# Patient Record
Sex: Female | Born: 1990 | Race: Black or African American | Hispanic: No | Marital: Single | Smoking: Former smoker
Health system: Southern US, Community
[De-identification: ages and names within clinical notes are randomized; demographics above are authoritative.]

## PROBLEM LIST (undated history)

## (undated) DIAGNOSIS — F419 Anxiety disorder, unspecified: Secondary | ICD-10-CM

## (undated) DIAGNOSIS — D649 Anemia, unspecified: Secondary | ICD-10-CM

## (undated) DIAGNOSIS — Z789 Other specified health status: Secondary | ICD-10-CM

## (undated) DIAGNOSIS — F32A Depression, unspecified: Secondary | ICD-10-CM

## (undated) HISTORY — DX: Other specified health status: Z78.9

## (undated) HISTORY — DX: Anemia, unspecified: D64.9

## (undated) HISTORY — DX: Anxiety disorder, unspecified: F41.9

## (undated) HISTORY — DX: Depression, unspecified: F32.A

---
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
## 2012-06-14 ENCOUNTER — Emergency Department: Payer: Self-pay

## 2012-06-14 LAB — MONONUCLEOSIS SCREEN: Mono Test: NEGATIVE

## 2012-10-17 ENCOUNTER — Emergency Department: Payer: Self-pay | Admitting: Emergency Medicine

## 2012-10-17 LAB — COMPREHENSIVE METABOLIC PANEL
Albumin: 3.5 g/dL (ref 3.4–5.0)
BUN: 13 mg/dL (ref 7–18)
Bilirubin,Total: 0.2 mg/dL (ref 0.2–1.0)
Calcium, Total: 9.3 mg/dL (ref 8.5–10.1)
Chloride: 104 mmol/L (ref 98–107)
Co2: 22 mmol/L (ref 21–32)
Creatinine: 0.71 mg/dL (ref 0.60–1.30)
EGFR (African American): >60
EGFR (Non-African Amer.): >60
Glucose: 87 mg/dL (ref 65–99)
Osmolality: 271 (ref 275–301)
Potassium: 4 mmol/L (ref 3.5–5.1)
SGOT(AST): 9 U/L — ABNORMAL LOW (ref 15–37)
SGPT (ALT): 14 U/L (ref 12–78)
Sodium: 136 mmol/L (ref 136–145)

## 2012-10-17 LAB — CBC
HGB: 12.8 g/dL (ref 12.0–16.0)
MCHC: 34.2 g/dL (ref 32.0–36.0)
RDW: 13 % (ref 11.5–14.5)

## 2012-10-17 LAB — HCG, QUANTITATIVE, PREGNANCY: Beta Hcg, Quant.: 30437 m[IU]/mL — ABNORMAL HIGH

## 2012-10-17 LAB — URINALYSIS, COMPLETE
Bilirubin,UR: NEGATIVE
Blood: NEGATIVE
Ketone: NEGATIVE
Protein: NEGATIVE
RBC,UR: 6 /HPF (ref 0–5)
WBC UR: 9 /HPF (ref 0–5)

## 2013-02-15 ENCOUNTER — Ambulatory Visit: Payer: Self-pay | Admitting: Advanced Practice Midwife

## 2013-02-15 DIAGNOSIS — I499 Cardiac arrhythmia, unspecified: Secondary | ICD-10-CM

## 2013-04-25 ENCOUNTER — Observation Stay: Payer: Self-pay | Admitting: Obstetrics and Gynecology

## 2013-04-25 LAB — URINALYSIS, COMPLETE
Bilirubin,UR: NEGATIVE
Blood: NEGATIVE
Glucose,UR: NEGATIVE mg/dL (ref 0–75)
Ketone: NEGATIVE
Leukocyte Esterase: NEGATIVE
Nitrite: NEGATIVE
Ph: 6 (ref 4.5–8.0)
Protein: NEGATIVE
RBC,UR: 1 /HPF (ref 0–5)
Specific Gravity: 1.021 (ref 1.003–1.030)
Squamous Epithelial: 11
WBC UR: 5 /HPF (ref 0–5)

## 2013-05-30 ENCOUNTER — Observation Stay: Payer: Self-pay | Admitting: Advanced Practice Midwife

## 2013-06-06 ENCOUNTER — Observation Stay: Payer: Self-pay

## 2013-06-14 ENCOUNTER — Inpatient Hospital Stay: Payer: Self-pay | Admitting: Obstetrics and Gynecology

## 2013-06-14 LAB — CBC WITH DIFFERENTIAL/PLATELET
Basophil %: 0.9 %
Eosinophil #: 0.1 10*3/uL (ref 0.0–0.7)
Eosinophil %: 0.7 %
HCT: 36.6 % (ref 35.0–47.0)
HGB: 12.8 g/dL (ref 12.0–16.0)
Lymphocyte #: 1.7 10*3/uL (ref 1.0–3.6)
MCHC: 34.8 g/dL (ref 32.0–36.0)
MCV: 94 fL (ref 80–100)
Monocyte #: 0.4 x10 3/mm (ref 0.2–0.9)
Monocyte %: 6.1 %
Platelet: 109 10*3/uL — ABNORMAL LOW (ref 150–440)
RBC: 3.91 10*6/uL (ref 3.80–5.20)
RDW: 13.9 % (ref 11.5–14.5)

## 2013-06-14 LAB — GC/CHLAMYDIA PROBE AMP

## 2013-09-17 LAB — HM PAP SMEAR: HM Pap smear: NEGATIVE

## 2014-03-13 ENCOUNTER — Emergency Department: Payer: Self-pay | Admitting: Emergency Medicine

## 2014-06-18 ENCOUNTER — Emergency Department: Payer: Self-pay | Admitting: Emergency Medicine

## 2014-06-20 LAB — BETA STREP CULTURE(ARMC)

## 2014-08-31 ENCOUNTER — Emergency Department: Payer: Self-pay | Admitting: Internal Medicine

## 2014-08-31 LAB — COMPREHENSIVE METABOLIC PANEL
ALK PHOS: 64 U/L
ALT: 13 U/L — AB
Albumin: 3.5 g/dL (ref 3.4–5.0)
Anion Gap: 4 — ABNORMAL LOW (ref 7–16)
BILIRUBIN TOTAL: 0.4 mg/dL (ref 0.2–1.0)
BUN: 7 mg/dL (ref 7–18)
CALCIUM: 8.3 mg/dL — AB (ref 8.5–10.1)
CHLORIDE: 110 mmol/L — AB (ref 98–107)
CO2: 25 mmol/L (ref 21–32)
CREATININE: 0.67 mg/dL (ref 0.60–1.30)
EGFR (African American): >60
Glucose: 84 mg/dL (ref 65–99)
Osmolality: 275 (ref 275–301)
Potassium: 3.7 mmol/L (ref 3.5–5.1)
SGOT(AST): 15 U/L (ref 15–37)
Sodium: 139 mmol/L (ref 136–145)
Total Protein: 7.5 g/dL (ref 6.4–8.2)

## 2014-08-31 LAB — CBC
HCT: 40.4 % (ref 35.0–47.0)
HGB: 13.7 g/dL (ref 12.0–16.0)
MCH: 31.6 pg (ref 26.0–34.0)
MCHC: 33.9 g/dL (ref 32.0–36.0)
MCV: 93 fL (ref 80–100)
Platelet: 167 10*3/uL (ref 150–440)
RBC: 4.33 10*6/uL (ref 3.80–5.20)
RDW: 13.4 % (ref 11.5–14.5)
WBC: 7.5 10*3/uL (ref 3.6–11.0)

## 2014-08-31 LAB — LIPASE, BLOOD: LIPASE: 210 U/L (ref 73–393)

## 2014-08-31 LAB — URINALYSIS, COMPLETE
BILIRUBIN, UR: NEGATIVE
Blood: NEGATIVE
Glucose,UR: NEGATIVE mg/dL (ref 0–75)
KETONE: NEGATIVE
Nitrite: NEGATIVE
PROTEIN: NEGATIVE
Ph: 6 (ref 4.5–8.0)
RBC,UR: 10 /HPF (ref 0–5)
Specific Gravity: 1.017 (ref 1.003–1.030)
WBC UR: 48 /HPF (ref 0–5)

## 2014-09-06 DIAGNOSIS — E663 Overweight: Secondary | ICD-10-CM

## 2014-09-06 HISTORY — DX: Overweight: E66.3

## 2015-01-27 ENCOUNTER — Emergency Department
Admit: 2015-01-27 | Disposition: A | Discharge status: Home / Self Care | Payer: Self-pay | Admitting: Emergency Medicine

## 2015-01-27 LAB — CBC
HCT: 41.3 % (ref 35.0–47.0)
HGB: 13.6 g/dL (ref 12.0–16.0)
MCH: 30.8 pg (ref 26.0–34.0)
MCHC: 33 g/dL (ref 32.0–36.0)
MCV: 93 fL (ref 80–100)
Platelet: 207 10*3/uL (ref 150–440)
RBC: 4.42 10*6/uL (ref 3.80–5.20)
RDW: 13 % (ref 11.5–14.5)
WBC: 7.6 10*3/uL (ref 3.6–11.0)

## 2015-01-27 LAB — URINALYSIS, COMPLETE
Bacteria: NONE SEEN
Bilirubin,UR: NEGATIVE
Blood: NEGATIVE
Glucose,UR: NEGATIVE mg/dL (ref 0–75)
Ketone: NEGATIVE
LEUKOCYTE ESTERASE: NEGATIVE
Nitrite: NEGATIVE
PH: 6 (ref 4.5–8.0)
PROTEIN: NEGATIVE
RBC,UR: 1 /HPF (ref 0–5)
Specific Gravity: 1.025 (ref 1.003–1.030)
Squamous Epithelial: 8

## 2015-01-27 LAB — COMPREHENSIVE METABOLIC PANEL
Albumin: 4 g/dL
Alkaline Phosphatase: 62 U/L
Anion Gap: 6 — ABNORMAL LOW (ref 7–16)
BILIRUBIN TOTAL: 0.6 mg/dL
BUN: 11 mg/dL
CALCIUM: 9 mg/dL
CHLORIDE: 105 mmol/L
CO2: 27 mmol/L
CREATININE: 0.63 mg/dL
EGFR (Non-African Amer.): >60
GLUCOSE: 87 mg/dL
Potassium: 3.7 mmol/L
SGOT(AST): 15 U/L
SGPT (ALT): 11 U/L — ABNORMAL LOW
Sodium: 138 mmol/L
Total Protein: 7.8 g/dL

## 2015-01-27 LAB — HCG, QUANTITATIVE, PREGNANCY: Beta Hcg, Quant.: <1 m[IU]/mL

## 2015-01-27 LAB — WET PREP, GENITAL

## 2015-01-29 LAB — GC/CHLAMYDIA PROBE AMP

## 2015-02-14 NOTE — Op Note (Signed)
PATIENT NAME:  Shelley Arias, Shelley Arias MR#:  [id number] DATE OF BIRTH:  1991/09/04  DATE OF PROCEDURE:  06/14/2013  PREOPERATIVE DIAGNOSIS:  Chorioamnionitis.   POSTOPERATIVE DIAGNOSIS:  Chorioamnionitis.  PROCEDURE:  Low transverse C-section.   ANESTHESIA:  Epidural.  SURGEON:  Senaida LangeLashawn Weaver-Lee, M.D.   ASSISTANLawson Fiscal:  Lori, scrub tech.   ESTIMATED BLOOD LOSS:  400 mL.   OPERATIVE FLUIDS:  650 mL, urine output 35 mL.   COMPLICATIONS:  None.   FINDINGS:  Vertex female infant, 3080 grams, Apgars 5 and 8, thick meconium, nuchal cord x 1, normal uterus, tubes and ovaries.   SPECIMENS:  Placenta.   INDICATIONS:  The patient is a 24 year old who presents with painful contractions.  An ultrasound was performed which showed oligohydramnios to AFI of 2.  Decision was made to induce labor at this point.  The patient progressed in labor slowly, however became febrile and began to have fetal tachycardia.  She also subsequently started having late decelerations.  Therefore, the decision was made to deliver via cesarean section.  Risks, benefits, indications and alternatives of the procedure were explained and informed consent was obtained.   PROCEDURE:  The patient was taken to the Operating Room with IV fluids running.  She was prepped and draped in the usual sterile fashion with a leftward tilt.  Pfannenstiel skin incision was made, carried down to the underlying fascia with a knife.  The fascia was nicked in the midline.  The incision was extended laterally.  The superior aspect of the fascia was grasped with Kocher clamps and the underlying rectus muscles were dissected off.  This was repeated on the inferior fascia.  Rectus muscles were divided in the midline.  The peritoneum was entered bluntly.  The opening was extended.  A bladder blade was placed.  The vesicouterine peritoneum was grasped with pick-ups and entered with Metzenbaum's.  The bladder flap was created digitally.  Hysterotomy incision was  made and carried out to underlying fetal membranes.  Thick meconium was encountered at this point.  The infant's head was grasped and delivered atraumatically through the hysterotomy incision.  The anterior and posterior shoulder were delivered after nuchal cord was reduced x 1 and the remainder of the body was delivered.  The cord was clamped x 2 and cut and the infant was handed to the waiting nursery staff.  The placenta was expressed.  The uterus was exteriorized and cleared of all clot and debris.  The hysterotomy incision was repaired with #0 Monocryl in a running locked fashion.  The uterus was returned to the abdomen.  The abdomen and gutters were irrigated with copious amounts of warm normal saline.  The peritoneum was closed with #0 Vicryl.  The On-Q pump apparatus was placed according to manufacturer's instructions.  The fascia was closed with a #1 PDS.  The skin was closed with 4-0 Vicryl and a Mellody DanceKeith needle.  Both On-Q catheters were bolused with 0.5% bupivacaine 5 mL each.  The catheters were secured to the patient's abdomen using Steri-Strips and Tegaderm.  The patient tolerated the procedure well.  Sponge, needle and instrument counts were correct x 2 and the patient was taken to the recovery room in stable condition.     ____________________________ Sonda PrimesLashawn A. Patton SallesWeaver-Lee, MD law:ea D: 06/15/2013 01:31:49 ET T: 06/15/2013 01:43:36 ET JOB#: [id number]  cc: Flint MelterLashawn A. Patton SallesWeaver-Lee, MD, <Dictator> Janyth ContesLASHAWN A WEAVER LEE MD ELECTRONICALLY SIGNED 06/21/2013 7:31

## 2015-03-04 NOTE — H&P (Signed)
L&D Evaluation:  History Expanded:  HPI 24 yo G1, EDD of 06/12/13 per 6 week US, presents at 38 1/7 weeks with c/o leaking fluid at 1800 that got her pants wet, noted another gush later. Denies ctx, VB or decreased FM. PNC at [facility] notable for early entry to care, irregular HR with normal EKG's with rare PVCs, mild anemia, GBS bacteriuria.   Blood Type (Maternal) A positive   Group B Strep Results Maternal (Result >5wks must be treated as unknown) positive  in urine   Maternal HIV Negative   Maternal Syphilis Ab Nonreactive   Maternal Varicella Non-Immune   Rubella Results (Maternal) immune   Maternal T-Dap Unknown   Presents with leaking fluid   Patient's Medical History No Chronic Illness   Patient's Surgical History none   Medications Pre Natal Vitamins  Iron   Allergies NKDA   Social History none  former tobacco   ROS:  ROS see HPI   Exam:  Vital Signs stable   General no apparent distress   Mental Status clear   Abdomen gravid, non-tender   Estimated Fetal Weight Average for gestational age   Fetal Position vertex confirmed by bedside US   Pelvic no external lesions, cervix closed and thick   Mebranes Intact, speculum exam: ferning, pooling & nitrizine negative   FHT normal rate with no decels, category 1 tracing   Ucx irregular   Other wet mount negative for BV, yeast or trichomonas   Impression:  Impression IUP at 38 1/7 weeks, intact membranes   Plan:  Comments Will monitor for further leaking, d/c after 1 hour if no further leaking. Has f/u tomorrow at [facility].   Follow Up Appointment already scheduled. 05/31/13   Electronic Signatures: Vella KohlerBrothers, Martavius Lusty K (CNM)  (Signed 06-Aug-14 22:24)  Authored: L&D Evaluation   Last Updated: 06-Aug-14 22:24 by Vella KohlerBrothers, Brynnan Rodenbaugh K (CNM)

## 2015-03-04 NOTE — H&P (Signed)
L&D Evaluation:  History:  HPI 24 year old G1 P0 with EDC=06/12/2013 by a 6week ultrasound presents at 40  2/7 weeks presents with contractions since 1830 last night and a blood tinged discharge this AM. "Contractions are unbearable" this AM and she rates the pain 10/10. Has been having an increased discharge the last few days. Prenatal care at [facility] remarkable for an early first trimester ultrasound dating the pregnancy and an anatomy scan at 18wk 1 day at which time the baby was measuring 20 wk 4 day. PNC also remarkable for GBS bacteruria, and anemia with a hmg=10.3gm/dl for which she has been treated with iron. LABS: A POS, RI (presumed), varicella NI.   Presents with contractions, and pink discharge   Patient's Medical History Anemia this pregnancy.   Patient's Surgical History none   Medications Iron   Allergies PCN, causes rash   Social History none   Family History Non-Contributory   ROS:  ROS see HPI   Exam:  Vital Signs stable  111/72, afebrile   Urine Protein not completed   General breathing thru contractions   Mental Status clear   Chest clear   Heart normal sinus rhythm, no murmur/gallop/rubs   Abdomen gravid, tender with contractions   Estimated Fetal Weight Average for gestational age   Fetal Position vtx   Edema trace edema   Reflexes 1+   Pelvic no external lesions, SSE: small pink tinged discharge. fern negative. cx:1/80-90%/-1   Mebranes Intact, AFI=2cm   FHT 135 to 140 baseline with repetitive mild variables with contractions   FHT Description mod variability, positive accel to 150 with scalp stim   Ucx irregular, q3-11 min apart   Skin dry   Impression:  Impression IUP at 40 2/7 weeks with oligohydraminos, and probable early labor   Plan:  Plan EFM/NST, monitor contractions and for cervical change, antibiotics for GBBS prophylaxis, Will admit and  start Kefzol for GBS coverage. Discussed FHR pattern  and significance of oligo with  patient and need to monitor FHR closely in labor.  Consider Pitocin augmentation or cervical balloon if does not progress.   Electronic Signatures: Trinna BalloonGutierrez, Zani Kyllonen L (CNM)  (Signed 21-Aug-14 08:38)  Authored: L&D Evaluation   Last Updated: 21-Aug-14 08:38 by Trinna BalloonGutierrez, Rayyan Burley L (CNM)

## 2015-08-01 ENCOUNTER — Encounter: Payer: Self-pay | Admitting: Emergency Medicine

## 2015-08-01 ENCOUNTER — Emergency Department
Admission: EM | Admit: 2015-08-01 | Discharge: 2015-08-01 | Disposition: A | Discharge status: Home / Self Care | Payer: BLUE CROSS/BLUE SHIELD | Attending: Emergency Medicine | Admitting: Emergency Medicine

## 2015-08-01 DIAGNOSIS — N926 Irregular menstruation, unspecified: Secondary | ICD-10-CM | POA: Insufficient documentation

## 2015-08-01 DIAGNOSIS — Z3202 Encounter for pregnancy test, result negative: Secondary | ICD-10-CM | POA: Insufficient documentation

## 2015-08-01 DIAGNOSIS — Z720 Tobacco use: Secondary | ICD-10-CM | POA: Diagnosis not present

## 2015-08-01 DIAGNOSIS — Z880 Allergy status to penicillin: Secondary | ICD-10-CM | POA: Insufficient documentation

## 2015-08-01 DIAGNOSIS — N390 Urinary tract infection, site not specified: Secondary | ICD-10-CM | POA: Insufficient documentation

## 2015-08-01 DIAGNOSIS — N939 Abnormal uterine and vaginal bleeding, unspecified: Secondary | ICD-10-CM | POA: Diagnosis present

## 2015-08-01 LAB — URINALYSIS COMPLETE WITH MICROSCOPIC (ARMC ONLY)
BACTERIA UA: NONE SEEN
Bilirubin Urine: NEGATIVE
Glucose, UA: NEGATIVE mg/dL
Ketones, ur: NEGATIVE mg/dL
Nitrite: NEGATIVE
PH: 5 (ref 5.0–8.0)
PROTEIN: 100 mg/dL — AB
Specific Gravity, Urine: 1.029 (ref 1.005–1.030)

## 2015-08-01 LAB — CBC
HCT: 36.8 % (ref 35.0–47.0)
HEMOGLOBIN: 12.6 g/dL (ref 12.0–16.0)
MCH: 32 pg (ref 26.0–34.0)
MCHC: 34.2 g/dL (ref 32.0–36.0)
MCV: 93.7 fL (ref 80.0–100.0)
Platelets: 163 10*3/uL (ref 150–440)
RBC: 3.93 MIL/uL (ref 3.80–5.20)
RDW: 13 % (ref 11.5–14.5)
WBC: 8.9 10*3/uL (ref 3.6–11.0)

## 2015-08-01 LAB — COMPREHENSIVE METABOLIC PANEL
ALT: 9 U/L — ABNORMAL LOW (ref 14–54)
ANION GAP: 8 (ref 5–15)
AST: 15 U/L (ref 15–41)
Albumin: 3.9 g/dL (ref 3.5–5.0)
Alkaline Phosphatase: 48 U/L (ref 38–126)
BUN: 8 mg/dL (ref 6–20)
CO2: 21 mmol/L — AB (ref 22–32)
Calcium: 9 mg/dL (ref 8.9–10.3)
Chloride: 110 mmol/L (ref 101–111)
Creatinine, Ser: 0.64 mg/dL (ref 0.44–1.00)
GFR calc non Af Amer: >60 mL/min (ref >60)
Glucose, Bld: 78 mg/dL (ref 65–99)
Potassium: 3.3 mmol/L — ABNORMAL LOW (ref 3.5–5.1)
SODIUM: 139 mmol/L (ref 135–145)
TOTAL PROTEIN: 7 g/dL (ref 6.5–8.1)
Total Bilirubin: 0.6 mg/dL (ref 0.3–1.2)

## 2015-08-01 LAB — POCT PREGNANCY, URINE: Preg Test, Ur: NEGATIVE

## 2015-08-01 LAB — WET PREP, GENITAL
CLUE CELLS WET PREP: NONE SEEN
Trich, Wet Prep: NONE SEEN
YEAST WET PREP: NONE SEEN

## 2015-08-01 LAB — CHLAMYDIA/NGC RT PCR (ARMC ONLY)
Chlamydia Tr: NOT DETECTED
N gonorrhoeae: NOT DETECTED

## 2015-08-01 LAB — HCG, QUANTITATIVE, PREGNANCY

## 2015-08-01 MED ORDER — IBUPROFEN 600 MG PO TABS
600.0000 mg | ORAL_TABLET | Freq: Once | ORAL | Status: AC
  Administered 2015-08-01: 600 mg via ORAL
  Filled 2015-08-01: qty 1

## 2015-08-01 MED ORDER — PHENAZOPYRIDINE HCL 100 MG PO TABS: 100.0000 mg | ORAL_TABLET | Freq: Three times a day (TID) | ORAL | Status: DC | PRN

## 2015-08-01 MED ORDER — CIPROFLOXACIN HCL 500 MG PO TABS: 500.0000 mg | ORAL_TABLET | Freq: Two times a day (BID) | ORAL | Status: AC

## 2015-08-01 NOTE — Discharge Instructions (Signed)

## 2015-08-01 NOTE — ED Provider Notes (Addendum)
[facility] Emergency Department Provider Note  ____________________________________________   I have reviewed the triage vital signs and the nursing notes.   HISTORY  Chief Complaint Vaginal Bleeding    HPI Shelley Arias is a 24 y.o. female who is G1 P1, with a history of irregular periods since coming off Depo-Provera a year ago. She states she usually has one menstrual period per month but it is at regular times. This point she had cramping and spotting. This is somewhat early for her although again she has irregular periods. Her last missed her period was September 14. She states that she has had no fever no chills no dysuria although she has had some urinary frequency. She did vomit this morning but she thinks it was because the cramps were bothering her. She has painful periods but this felt somewhat different. She is not having pain right now. Seems to come and go and cramping manner. She denies any headache, she did have some lower abdominal cramping but no other abdominal discomfort. She has not been going through 11 pad an hour, she is not lightheaded. She does not believe she is pregnant. She's had no vaginal discharge.  History reviewed. No pertinent past medical history.  There are no active problems to display for this patient.   History reviewed. No pertinent past surgical history.  No current outpatient prescriptions on file.  Allergies Penicillins  History reviewed. No pertinent family history.  Social History Social History  Substance Use Topics  . Smoking status: Current Every Day Smoker  . Smokeless tobacco: None  . Alcohol Use: No    Review of Systems Constitutional: No fever/chills Eyes: No visual changes. ENT: No sore throat. No stiff neck no neck pain Cardiovascular: Denies chest pain. Respiratory: Denies shortness of breath. Gastrointestinal:   See history of present illness regarding vomiting.  No diarrhea.  No  constipation. Genitourinary: Negative for dysuria. Musculoskeletal: Negative lower extremity swelling Skin: Negative for rash. Neurological: Negative for headaches, focal weakness or numbness. 10-point ROS otherwise negative.  ____________________________________________   PHYSICAL EXAM:  VITAL SIGNS: ED Triage Vitals  Enc Vitals Group     BP 08/01/15 1135 108/75 mmHg     Pulse Rate 08/01/15 1135 77     Resp 08/01/15 1135 20     Temp 08/01/15 1135 97.3 F (36.3 C)     Temp src --      SpO2 08/01/15 1135 93 %     Weight 08/01/15 1135 120 lb (54.432 kg)     Height 08/01/15 1135  (1.549 m)     Head Cir --      Peak Flow --      Pain Score 08/01/15 1136 9     Pain Loc --      Pain Edu? --      Excl. in GC? --     Constitutional: Alert and oriented. Well appearing and in no acute distress. Eyes: Conjunctivae are normal. PERRL. EOMI. Head: Atraumatic. Nose: No congestion/rhinnorhea. Mouth/Throat: Mucous membranes are moist.  Oropharynx non-erythematous. Neck: No stridor.   Nontender with no meningismus Cardiovascular: Normal rate, regular rhythm. Grossly normal heart sounds.  Good peripheral circulation. Respiratory: Normal respiratory effort.  No retractions. Lungs CTAB. Gastrointestinal: Soft and nontender. No distention. No guarding no rebound Back:  There is no focal tenderness or step off there is no midline tenderness there are no lesions noted. there is no CVA tenderness Pelvic exam: Female nurse chaperone present, no external lesions  noted, physiologic vaginal discharge noted with no purulent discharge, no cervical motion tenderness, no adnexal tenderness or mass, there is no significant uterine tenderness or mass. No vaginal bleeding Musculoskeletal: No lower extremity tenderness. No joint effusions, no DVT signs strong distal pulses no edema Neurologic:  Normal speech and language. No gross focal neurologic deficits are appreciated.  Skin:  Skin is warm, dry and  intact. No rash noted. Psychiatric: Mood and affect are normal. Speech and behavior are normal.  ____________________________________________   LABS (all labs ordered are listed, but only abnormal results are displayed)  Labs Reviewed  WET PREP, GENITAL  CHLAMYDIA/NGC RT PCR ([facility] ONLY)  CBC  COMPREHENSIVE METABOLIC PANEL  HCG, QUANTITATIVE, PREGNANCY  URINALYSIS COMPLETEWITH MICROSCOPIC ([facility] ONLY)  POC URINE PREG, ED  POCT PREGNANCY, URINE   ____________________________________________   ____________________________________________  RADIOLOGY  ____________________________________________   PROCEDURES  Procedure(s) performed: None  Critical Care performed: None  ____________________________________________   INITIAL IMPRESSION / ASSESSMENT AND PLAN / ED COURSE  Pertinent labs & imaging results that were available during my care of the patient were reviewed by me and considered in my medical decision making (see chart for details).  Patient here with spotting and cramping. History of irregular periods. She is not pregnant, blood work is reassuring. There is no bleeding at this time, heart rate is reassuring, she does have a baseline somewhat lower blood pressure. Patient does have a long history of painful irregular menses. Given some mild urinary frequency, we will check a urinalysis, there is no evidence of CMT or PID. With a negative pregnancy test, do not believe she has an ectopic pregnancy. Abdominal exam at this time is benign with no evidence of appendicitis, and in any event that does not cause vaginal spotting. We will, therefore, continue to observe the patient here in the emergency department. She is requesting by mouth. If blood work is normal, is my hope that we can get her safely home with close follow-up with OB/GYN for outpatient management of painful irregular menses. Patient has been sleeping in the room with no evidence of distress and told me  that her pain was gone when I saw her. ____________________________________________   FINAL CLINICAL IMPRESSION(S) / ED DIAGNOSES  Final diagnoses:  None   ----------------------------------------- 3:01 PM on 08/01/2015 -----------------------------------------  Evidence of UTI but no evidence of pyelonephritis serial abdominal exams are benign, patient eager to go home we'll send her home with antibiotics pending culture results.   Jeanmarie Plant, MD 08/01/15 1259  Jeanmarie Plant, MD 08/01/15 1501  Jeanmarie Plant, MD 08/01/15 [phone]

## 2015-08-01 NOTE — ED Notes (Signed)
Pt to ed with c/o vaginal bleeding, pt states last menstrual period was 9/14 and then started bleeding again today.  Pt reports lower abd pain .

## 2015-08-05 LAB — URINE CULTURE: Culture: >=100000

## 2016-01-22 ENCOUNTER — Encounter: Payer: Self-pay | Admitting: Emergency Medicine

## 2016-01-22 ENCOUNTER — Emergency Department
Admission: EM | Admit: 2016-01-22 | Discharge: 2016-01-22 | Disposition: A | Discharge status: Home / Self Care | Payer: Medicaid Other | Attending: Emergency Medicine | Admitting: Emergency Medicine

## 2016-01-22 DIAGNOSIS — J09X2 Influenza due to identified novel influenza A virus with other respiratory manifestations: Secondary | ICD-10-CM | POA: Diagnosis not present

## 2016-01-22 DIAGNOSIS — J111 Influenza due to unidentified influenza virus with other respiratory manifestations: Secondary | ICD-10-CM

## 2016-01-22 DIAGNOSIS — F17210 Nicotine dependence, cigarettes, uncomplicated: Secondary | ICD-10-CM | POA: Diagnosis not present

## 2016-01-22 DIAGNOSIS — R05 Cough: Secondary | ICD-10-CM | POA: Diagnosis present

## 2016-01-22 LAB — RAPID INFLUENZA A&B ANTIGENS
Influenza A (ARMC): POSITIVE — AB
Influenza B (ARMC): NEGATIVE

## 2016-01-22 MED ORDER — OSELTAMIVIR PHOSPHATE 75 MG PO CAPS: 75.0000 mg | ORAL_CAPSULE | Freq: Two times a day (BID) | ORAL | Status: AC

## 2016-01-22 MED ORDER — BENZONATATE 100 MG PO CAPS: 100.0000 mg | ORAL_CAPSULE | Freq: Three times a day (TID) | ORAL | Status: DC | PRN

## 2016-01-22 NOTE — ED Notes (Signed)
States she fever body aches with cough and fever yesterday   Pt has a low grade fever on arrival

## 2016-01-22 NOTE — ED Provider Notes (Signed)
[facility] Emergency Department Provider Note ____________________________________________  Time seen: 1241  I have reviewed the triage vital signs and the nursing notes.  HISTORY  Chief Complaint  Cough and Generalized Body Aches   HPI OLUWASEYI TULL is a 25 y.o. female presents to the ED for evaluation of sudden onset of body chills, cough, sinus congestion and subjective fevers, with onset yesterday. She denies receiving the vaccine for the flu last year. She also notes some sore throat and hoarseness related to cough and postnasal drainage. The patient has been dosing TheraFlu for symptom relief.  History reviewed. No pertinent past medical history.  There are no active problems to display for this patient.   Past Surgical History  Procedure Laterality Date  . Cesarean section      Current Outpatient Rx  Name  Route  Sig  Dispense  Refill  . benzonatate (TESSALON PERLES) 100 MG capsule   Oral   Take 1 capsule (100 mg total) by mouth 3 (three) times daily as needed for cough (Take 1-2 per dose).   30 capsule   0   . oseltamivir (TAMIFLU) 75 MG capsule   Oral   Take 1 capsule (75 mg total) by mouth 2 (two) times daily.   10 capsule   0   . phenazopyridine (PYRIDIUM) 100 MG tablet   Oral   Take 1 tablet (100 mg total) by mouth 3 (three) times daily as needed for pain.   20 tablet   0    Allergies Penicillins  No family history on file.  Social History Social History  Substance Use Topics  . Smoking status: Current Every Day Smoker -- 0.50 packs/day    Types: Cigarettes  . Smokeless tobacco: None  . Alcohol Use: No    Review of Systems  Constitutional: Positive for subjective fevers. Eyes: Negative for visual changes. ENT: Positive for sore throat. Cardiovascular: Negative for chest pain. Respiratory: Negative for shortness of breath. Reports non-productive cough Gastrointestinal: Negative for abdominal pain, vomiting and  diarrhea. Musculoskeletal: Negative for back pain. Skin: Negative for rash. Neurological: Negative for headaches, focal weakness or numbness. ____________________________________________  PHYSICAL EXAM:  VITAL SIGNS: ED Triage Vitals  Enc Vitals Group     BP --      Pulse Rate 01/22/16 1153 94     Resp 01/22/16 1153 18     Temp 01/22/16 1153 100.4 F (38 C)     Temp Source 01/22/16 1153 Oral     SpO2 01/22/16 1153 97 %     Weight 01/22/16 1153 136 lb (61.689 kg)     Height 01/22/16 1153  (1.549 m)     Head Cir --      Peak Flow --      Pain Score --      Pain Loc --      Pain Edu? --      Excl. in GC? --    Constitutional: Alert and oriented. Well appearing and in no distress. Head: Normocephalic and atraumatic.      Eyes: Conjunctivae are normal. PERRL. Normal extraocular movements      Ears: Canals clear. TMs intact bilaterally.   Nose: No congestion/rhinorrhea.   Mouth/Throat: Mucous membranes are moist.   Neck: Supple. No thyromegaly. Hematological/Lymphatic/Immunological: No cervical lymphadenopathy. Cardiovascular: Normal rate, regular rhythm.  Respiratory: Normal respiratory effort. No wheezes/rales/rhonchi. Gastrointestinal: Soft and nontender. No distention. Musculoskeletal: Nontender with normal range of motion in all extremities.  Neurologic:  Normal gait  without ataxia. Normal speech and language. No gross focal neurologic deficits are appreciated. Skin:  Skin is warm, dry and intact. No rash noted. ____________________________________________   LABS (pertinent positives/negatives) Labs Reviewed  RAPID INFLUENZA A&B ANTIGENS ([facility] ONLY) - Abnormal; Notable for the following:    Influenza A ([facility]) POSITIVE (*)    All other components within normal limits  ____________________________________________  INITIAL IMPRESSION / ASSESSMENT AND PLAN / ED COURSE  Patient with an acute viral upper respiratory infection with this consistent with a  likely influenza. Patient was discharged with prescription for Tamiflu dose as directed. She'll also be provided with procedures for Tessalon Perle. She will continue over-the-counter cough and cold medicines for symptom management as discussed. I'll awoke with primary care provider and return to the ED for acutely worsening symptoms as discussed. ____________________________________________  FINAL CLINICAL IMPRESSION(S) / ED DIAGNOSES  Final diagnoses:  Influenza      Lissa HoardJenise V Bacon Jahsir Rama, PA-C 01/22/16 1400  Governor Rooksebecca Lord, MD 01/22/16 1511

## 2016-01-22 NOTE — Discharge Instructions (Signed)
Influenza, Adult Influenza ("the flu") is a viral infection of the respiratory tract. It occurs more often in winter months because people spend more time in close contact with one another. Influenza can make you feel very sick. Influenza easily spreads from person to person (contagious). CAUSES  Influenza is caused by a virus that infects the respiratory tract. You can catch the virus by breathing in droplets from an infected person's cough or sneeze. You can also catch the virus by touching something that was recently contaminated with the virus and then touching your mouth, nose, or eyes. RISKS AND COMPLICATIONS You may be at risk for a more severe case of influenza if you smoke cigarettes, have diabetes, have chronic heart disease (such as heart failure) or lung disease (such as asthma), or if you have a weakened immune system. Elderly people and pregnant women are also at risk for more serious infections. The most common problem of influenza is a lung infection (pneumonia). Sometimes, this problem can require emergency medical care and may be life threatening. SIGNS AND SYMPTOMS  Symptoms typically last 4 to 10 days and may include:  Fever.  Chills.  Headache, body aches, and muscle aches.  Sore throat.  Chest discomfort and cough.  Poor appetite.  Weakness or feeling tired.  Dizziness.  Nausea or vomiting. DIAGNOSIS  Diagnosis of influenza is often made based on your history and a physical exam. A nose or throat swab test can be done to confirm the diagnosis. TREATMENT  In mild cases, influenza goes away on its own. Treatment is directed at relieving symptoms. For more severe cases, your health care provider may prescribe antiviral medicines to shorten the sickness. Antibiotic medicines are not effective because the infection is caused by a virus, not by bacteria. HOME CARE INSTRUCTIONS  Take medicines only as directed by your health care provider.  Use a cool mist humidifier  to make breathing easier.  Get plenty of rest until your temperature returns to normal. This usually takes 3 to 4 days.  Drink enough fluid to keep your urine clear or pale yellow.  Cover yourmouth and nosewhen coughing or sneezing,and wash your handswellto prevent thevirusfrom spreading.  Stay homefromwork orschool untilthe fever is gonefor at least 691full day. PREVENTION  An annual influenza vaccination (flu shot) is the best way to avoid getting influenza. An annual flu shot is now routinely recommended for all adults in the [place] SEEK MEDICAL CARE IF:  You experiencechest pain, yourcough worsens,or you producemore mucus.  Youhave nausea,vomiting, ordiarrhea.  Your fever returns or gets worse. SEEK IMMEDIATE MEDICAL CARE IF:  You havetrouble breathing, you become short of breath,or your skin ornails becomebluish.  You have severe painor stiffnessin the neck.  You develop a sudden headache, or pain in the face or ear.  You have nausea or vomiting that you cannot control. MAKE SURE YOU:   Understand these instructions.  Will watch your condition.  Will get help right away if you are not doing well or get worse.   This information is not intended to replace advice given to you by your health care provider. Make sure you discuss any questions you have with your health care provider.   Document Released: 10/08/2000 Document Revised: 11/01/2014 Document Reviewed: 01/10/2012 Elsevier Interactive Patient Education [employer]2016 [employer]   Your exam is consistent with an influenza infection. Continue to treats fevers and other symptoms as needed. Take the Tamiflu as directed. Rest and hydrate as necessary.

## 2016-01-22 NOTE — ED Notes (Signed)
Body aches, chills, cough, sinus congestion since yesterday.

## 2016-05-17 ENCOUNTER — Emergency Department
Admission: EM | Admit: 2016-05-17 | Discharge: 2016-05-17 | Disposition: A | Discharge status: Home / Self Care | Payer: Medicaid Other | Attending: Emergency Medicine | Admitting: Emergency Medicine

## 2016-05-17 DIAGNOSIS — F419 Anxiety disorder, unspecified: Secondary | ICD-10-CM

## 2016-05-17 DIAGNOSIS — Z79899 Other long term (current) drug therapy: Secondary | ICD-10-CM | POA: Diagnosis not present

## 2016-05-17 DIAGNOSIS — R112 Nausea with vomiting, unspecified: Secondary | ICD-10-CM | POA: Diagnosis not present

## 2016-05-17 DIAGNOSIS — R1084 Generalized abdominal pain: Secondary | ICD-10-CM | POA: Diagnosis present

## 2016-05-17 DIAGNOSIS — F17210 Nicotine dependence, cigarettes, uncomplicated: Secondary | ICD-10-CM | POA: Insufficient documentation

## 2016-05-17 LAB — COMPREHENSIVE METABOLIC PANEL
ALBUMIN: 4.1 g/dL (ref 3.5–5.0)
ALK PHOS: 44 U/L (ref 38–126)
ALT: 8 U/L — AB (ref 14–54)
AST: 14 U/L — AB (ref 15–41)
Anion gap: 6 (ref 5–15)
BILIRUBIN TOTAL: 0.3 mg/dL (ref 0.3–1.2)
BUN: 11 mg/dL (ref 6–20)
CALCIUM: 9.4 mg/dL (ref 8.9–10.3)
CO2: 21 mmol/L — AB (ref 22–32)
Chloride: 110 mmol/L (ref 101–111)
Creatinine, Ser: 0.8 mg/dL (ref 0.44–1.00)
GFR calc Af Amer: >60 mL/min (ref >60)
GFR calc non Af Amer: >60 mL/min (ref >60)
GLUCOSE: 94 mg/dL (ref 65–99)
Potassium: 3.9 mmol/L (ref 3.5–5.1)
SODIUM: 137 mmol/L (ref 135–145)
TOTAL PROTEIN: 7.6 g/dL (ref 6.5–8.1)

## 2016-05-17 LAB — URINALYSIS COMPLETE WITH MICROSCOPIC (ARMC ONLY)
BACTERIA UA: NONE SEEN
Bilirubin Urine: NEGATIVE
GLUCOSE, UA: NEGATIVE mg/dL
Ketones, ur: NEGATIVE mg/dL
Leukocytes, UA: NEGATIVE
NITRITE: NEGATIVE
PROTEIN: NEGATIVE mg/dL
SPECIFIC GRAVITY, URINE: 1.016 (ref 1.005–1.030)
pH: 5 (ref 5.0–8.0)

## 2016-05-17 LAB — CBC
HCT: 38.7 % (ref 35.0–47.0)
Hemoglobin: 13.5 g/dL (ref 12.0–16.0)
MCH: 32.6 pg (ref 26.0–34.0)
MCHC: 34.9 g/dL (ref 32.0–36.0)
MCV: 93.6 fL (ref 80.0–100.0)
Platelets: 163 10*3/uL (ref 150–440)
RBC: 4.13 MIL/uL (ref 3.80–5.20)
RDW: 12.8 % (ref 11.5–14.5)
WBC: 6.5 10*3/uL (ref 3.6–11.0)

## 2016-05-17 LAB — LIPASE, BLOOD: LIPASE: 44 U/L (ref 11–51)

## 2016-05-17 LAB — POCT PREGNANCY, URINE: PREG TEST UR: NEGATIVE

## 2016-05-17 NOTE — ED Notes (Signed)
Pt states vomiting every morning for the past month, states she is under a lot of stress lately, states headache, states sharp abd pain, pt awake and alert, pt tearful during assessment

## 2016-05-17 NOTE — Discharge Instructions (Signed)
Your exam and evaluation are reassuring in the Emergency Department today.  As we discussed, although no certain cause was found, I suspect anxiety may be contributed. I am recommending he follow-up with a primary care physician, as well as a Automotive engineer.  Return to the emergency department for any worsening condition including nor worsening abdominal pain, vomiting blood, black or bloody stools, fever, chest pain or trouble breathing, or any other symptoms concerning to you.

## 2016-05-17 NOTE — ED Provider Notes (Signed)
[facility] Emergency Department Provider Note   ____________________________________________  Time seen: Approx  11:10 AM I have reviewed the triage vital signs and the triage nursing note.  HISTORY  Chief Complaint Abdominal Pain and Shortness of Breath   Historian Patient  HPI Shelley Arias is a 25 y.o. female who states that she follows with the health department and does not have any significant past medical history, came in today with about 1 month of various complaints including intermittent generalized abdominal pains, intermittent episodes where she feels short of breath, nausea especially in the mornings with some vomiting. She chose to come in today because she felt bad and when I asked her to describe bad she states that she felt generalized weakness and didn't feel like getting out of bed this morning.  No chest pain. No fevers. No black or bloody stools. She is on Depo-Provera.  No vaginal discharge or vaginal/pelvic pain.  She does state that she has some anxiety and feels under a lot of stress and strain. No depression or suicidal ideation. She previously had been evaluated for anxiety but never started any medication treatments.  Symptoms are mild to moderate and nothing seems to make worse or better    History reviewed. No pertinent past medical history.  There are no active problems to display for this patient.   Past Surgical History:  Procedure Laterality Date  . CESAREAN SECTION      Current Outpatient Rx  . Order #: [id number] Class: Print  . Order #: [id number] Class: Print    Allergies Penicillins  No family history on file.  Social History Social History  Substance Use Topics  . Smoking status: Current Every Day Smoker    Packs/day: 0.50    Types: Cigarettes  . Smokeless tobacco: Never Used  . Alcohol use No    Review of Systems  Constitutional: Negative for fever. Eyes: Negative for visual changes. ENT:  Negative for sore throat. Cardiovascular: Negative for chest pain. Respiratory: Negative for cough or pleuritic chest pain. Gastrointestinal: Negative for constipation or diarrhea. Genitourinary: Negative for dysuria. Musculoskeletal: Negative for back pain. Skin: Negative for rash. Neurological: Negative for headache. 10 point Review of Systems otherwise negative ____________________________________________   PHYSICAL EXAM:  VITAL SIGNS: ED Triage Vitals  Enc Vitals Group     BP 05/17/16 0916 106/70     Pulse Rate 05/17/16 0916 67     Resp 05/17/16 0916 18     Temp 05/17/16 0916 98.3 F (36.8 C)     Temp Source 05/17/16 0916 Oral     SpO2 05/17/16 0916 100 %     Weight 05/17/16 0916 135 lb (61.2 kg)     Height 05/17/16 0916  (1.549 m)     Head Circumference --      Peak Flow --      Pain Score 05/17/16 0918 9     Pain Loc --      Pain Edu? --      Excl. in GC? --      Constitutional: Alert and oriented. Well appearing and in no distress. HEENT   Head: Normocephalic and atraumatic.      Eyes: Conjunctivae are normal. PERRL. Normal extraocular movements.      Ears:         Nose: No congestion/rhinnorhea.   Mouth/Throat: Mucous membranes are moist.   Neck: No stridor. Cardiovascular/Chest: Normal rate, regular rhythm.  No murmurs, rubs, or gallops. Respiratory: Normal respiratory effort without tachypnea  nor retractions. Breath sounds are clear and equal bilaterally. No wheezes/rales/rhonchi. Gastrointestinal: Soft. No distention, no guarding, no rebound. Nontender.  Genitourinary/rectal:Deferred Musculoskeletal: Nontender with normal range of motion in all extremities. No joint effusions.  No lower extremity tenderness.  No edema. Neurologic:  Normal speech and language. No gross or focal neurologic deficits are appreciated. Skin:  Skin is warm, dry and intact. No rash noted. Psychiatric: Mood and affect are normal. Speech and behavior are normal.  Patient exhibits appropriate insight and judgment.  ____________________________________________   EKG I, Governor Rooks, MD, the attending physician have personally viewed and interpreted all ECGs.  None ____________________________________________  LABS (pertinent positives/negatives)  Labs Reviewed  COMPREHENSIVE METABOLIC PANEL - Abnormal; Notable for the following:       Result Value   CO2 21 (*)    AST 14 (*)    ALT 8 (*)    All other components within normal limits  URINALYSIS COMPLETEWITH MICROSCOPIC ([facility] ONLY) - Abnormal; Notable for the following:    Color, Urine YELLOW (*)    APPearance CLEAR (*)    Hgb urine dipstick 3+ (*)    Squamous Epithelial / LPF 0-5 (*)    All other components within normal limits  LIPASE, BLOOD  CBC  POC URINE PREG, ED  POCT PREGNANCY, URINE    ____________________________________________  RADIOLOGY All Xrays were viewed by me. Imaging interpreted by Radiologist.  None __________________________________________  PROCEDURES  Procedure(s) performed: None  Critical Care performed: None  ____________________________________________   ED COURSE / ASSESSMENT AND PLAN  Pertinent labs & imaging results that were available during my care of the patient were reviewed by me and considered in my medical decision making (see chart for details).    This patient has a number of complaints that seemed to have the ongoing for about a month now coinciding when she's had some additional stressors.  Her main complaint today was generalized fatigue and lack of motivation which sounds more anxiety related.  In terms of the abdominal discomfort she has a soft abdomen and reassuring laboratory studies and I don't feel any imaging is indicated this point time.  In terms of the nausea and vomiting, her electrolytes are stable and she is not having ongoing symptoms now. Again I am most suspicious that this may be related to anxiety as  well.  Ultimately, I am having her follow back up with her primary care physician for additional workup for generalized fatigue, and I do think she should see a mental health provider for anxiety and possible depression.  She does not have any indication for emergency psychiatric evaluation for commitment.  She initially also stated that she had some shortness of breath, but her lungs are clear with no hypoxia and no ongoing symptoms, I'm not concerned for acute emergency cause of shortness of breath.    CONSULTATIONS:   None   Patient / Family / Caregiver informed of clinical course, medical decision-making process, and agree with plan.   I discussed return precautions, follow-up instructions, and discharged instructions with patient and/or family.   ___________________________________________   FINAL CLINICAL IMPRESSION(S) / ED DIAGNOSES   Final diagnoses:  Anxiety  Generalized abdominal pain              Note: This dictation was prepared with Dragon dictation. Any transcriptional errors that result from this process are unintentional    Governor Rooks, MD 05/17/16 1146

## 2016-05-17 NOTE — ED Triage Notes (Signed)
Pt c/o upper abd pain with SOB for over 2 months, states she has been under a lot of stress.."since I been stressing"

## 2016-09-03 ENCOUNTER — Emergency Department: Payer: Medicaid Other

## 2016-09-03 ENCOUNTER — Emergency Department
Admission: EM | Admit: 2016-09-03 | Discharge: 2016-09-03 | Disposition: A | Discharge status: Home / Self Care | Payer: Medicaid Other | Attending: Emergency Medicine | Admitting: Emergency Medicine

## 2016-09-03 ENCOUNTER — Encounter: Payer: Self-pay | Admitting: Intensive Care

## 2016-09-03 DIAGNOSIS — Y999 Unspecified external cause status: Secondary | ICD-10-CM | POA: Diagnosis not present

## 2016-09-03 DIAGNOSIS — F17210 Nicotine dependence, cigarettes, uncomplicated: Secondary | ICD-10-CM | POA: Insufficient documentation

## 2016-09-03 DIAGNOSIS — S20212A Contusion of left front wall of thorax, initial encounter: Secondary | ICD-10-CM | POA: Diagnosis not present

## 2016-09-03 DIAGNOSIS — S0990XA Unspecified injury of head, initial encounter: Secondary | ICD-10-CM | POA: Diagnosis not present

## 2016-09-03 DIAGNOSIS — S60222A Contusion of left hand, initial encounter: Secondary | ICD-10-CM | POA: Insufficient documentation

## 2016-09-03 DIAGNOSIS — Y9389 Activity, other specified: Secondary | ICD-10-CM | POA: Diagnosis not present

## 2016-09-03 DIAGNOSIS — Y92000 Kitchen of unspecified non-institutional (private) residence as  the place of occurrence of the external cause: Secondary | ICD-10-CM | POA: Insufficient documentation

## 2016-09-03 DIAGNOSIS — S60221A Contusion of right hand, initial encounter: Secondary | ICD-10-CM | POA: Diagnosis not present

## 2016-09-03 DIAGNOSIS — Z79899 Other long term (current) drug therapy: Secondary | ICD-10-CM | POA: Diagnosis not present

## 2016-09-03 DIAGNOSIS — S299XXA Unspecified injury of thorax, initial encounter: Secondary | ICD-10-CM | POA: Diagnosis present

## 2016-09-03 MED ORDER — OXYCODONE-ACETAMINOPHEN 5-325 MG PO TABS
1.0000 | ORAL_TABLET | Freq: Once | ORAL | Status: AC
  Administered 2016-09-03: 1 via ORAL
  Filled 2016-09-03: qty 1

## 2016-09-03 MED ORDER — IBUPROFEN 800 MG PO TABS: 800.0000 mg | ORAL_TABLET | Freq: Three times a day (TID) | ORAL | 0 refills | Status: DC | PRN
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
## 2017-08-02 ENCOUNTER — Encounter: Payer: Self-pay | Admitting: *Deleted

## 2017-08-02 ENCOUNTER — Emergency Department
Admission: EM | Admit: 2017-08-02 | Discharge: 2017-08-02 | Disposition: A | Discharge status: Home / Self Care | Payer: Self-pay | Attending: Emergency Medicine | Admitting: Emergency Medicine

## 2017-08-02 DIAGNOSIS — S161XXA Strain of muscle, fascia and tendon at neck level, initial encounter: Secondary | ICD-10-CM | POA: Insufficient documentation

## 2017-08-02 DIAGNOSIS — Y929 Unspecified place or not applicable: Secondary | ICD-10-CM | POA: Insufficient documentation

## 2017-08-02 DIAGNOSIS — Y939 Activity, unspecified: Secondary | ICD-10-CM | POA: Insufficient documentation

## 2017-08-02 DIAGNOSIS — Z79899 Other long term (current) drug therapy: Secondary | ICD-10-CM | POA: Insufficient documentation

## 2017-08-02 DIAGNOSIS — F17210 Nicotine dependence, cigarettes, uncomplicated: Secondary | ICD-10-CM | POA: Insufficient documentation

## 2017-08-02 DIAGNOSIS — X501XXA Overexertion from prolonged static or awkward postures, initial encounter: Secondary | ICD-10-CM | POA: Insufficient documentation

## 2017-08-02 DIAGNOSIS — Y999 Unspecified external cause status: Secondary | ICD-10-CM | POA: Insufficient documentation

## 2017-08-02 MED ORDER — KETOROLAC TROMETHAMINE 30 MG/ML IJ SOLN
30.0000 mg | Freq: Once | INTRAMUSCULAR | Status: AC
  Administered 2017-08-02: 30 mg via INTRAMUSCULAR
  Filled 2017-08-02: qty 1

## 2017-08-02 MED ORDER — ORPHENADRINE CITRATE 30 MG/ML IJ SOLN
60.0000 mg | Freq: Two times a day (BID) | INTRAMUSCULAR | Status: DC
  Administered 2017-08-02: 60 mg via INTRAMUSCULAR
  Filled 2017-08-02: qty 2

## 2017-08-02 MED ORDER — CYCLOBENZAPRINE HCL 5 MG PO TABS: 5.0000 mg | ORAL_TABLET | Freq: Three times a day (TID) | ORAL | 0 refills | Status: AC | PRN

## 2017-08-02 NOTE — ED Provider Notes (Signed)
[facility] Emergency Department Provider Note  ____________________________________________  Time seen: Approximately 8:00 PM  I have reviewed the triage vital signs and the nursing notes.   HISTORY  Chief Complaint Neck Pain    HPI Shelley Arias is a 26 y.o. female that presents to the emergency department for right-sided neck pain for 2 days. Patient fell asleep with her son on the couch and woke up with pain on the right side of her neck. It does not radiate. It is worse with movement.  No alleviating measures have been attempted. No fever, chills, nausea, vomiting, abdominal pain, numbness, tingling.   History reviewed. No pertinent past medical history.  There are no active problems to display for this patient.   Past Surgical History:  Procedure Laterality Date  . CESAREAN SECTION      Prior to Admission medications   Medication Sig Start Date End Date Taking? Authorizing Provider  benzonatate (TESSALON PERLES) 100 MG capsule Take 1 capsule (100 mg total) by mouth 3 (three) times daily as needed for cough (Take 1-2 per dose). 01/22/16   Menshew, Charlesetta Ivory, PA-C  cyclobenzaprine (FLEXERIL) 5 MG tablet Take 1 tablet (5 mg total) by mouth 3 (three) times daily as needed for muscle spasms. 08/02/17 08/09/17  Enid Derry, PA-C  ibuprofen (ADVIL,MOTRIN) 800 MG tablet Take 1 tablet (800 mg total) by mouth every 8 (eight) hours as needed. 09/03/16   Myrna Blazer, MD  phenazopyridine (PYRIDIUM) 100 MG tablet Take 1 tablet (100 mg total) by mouth 3 (three) times daily as needed for pain. 08/01/15   Jeanmarie Plant, MD    Allergies Penicillins  No family history on file.  Social History Social History  Substance Use Topics  . Smoking status: Current Every Day Smoker    Packs/day: 0.50    Types: Cigarettes  . Smokeless tobacco: Never Used  . Alcohol use No     Review of Systems  Constitutional: No  fever/chills Cardiovascular: No chest pain. Respiratory: No SOB. Gastrointestinal: No abdominal pain.  No nausea, no vomiting.  Skin: Negative for rash, abrasions, lacerations, ecchymosis. Neurological: Negative for headaches, numbness or tingling   ____________________________________________   PHYSICAL EXAM:  VITAL SIGNS: ED Triage Vitals  Enc Vitals Group     BP 08/02/17 1812 (!) 113/59     Pulse Rate 08/02/17 1812 (!) 52     Resp 08/02/17 1812 18     Temp 08/02/17 1812 98.7 F (37.1 C)     Temp Source 08/02/17 1812 Oral     SpO2 08/02/17 1812 100 %     Weight 08/02/17 1812 135 lb (61.2 kg)     Height 08/02/17 1812  (1.549 m)     Head Circumference --      Peak Flow --      Pain Score 08/02/17 1811 7     Pain Loc --      Pain Edu? --      Excl. in GC? --      Constitutional: Alert and oriented. Well appearing and in no acute distress. Eyes: Conjunctivae are normal. PERRL. EOMI. Head: Atraumatic. ENT:      Ears:      Nose: No congestion/rhinnorhea.      Mouth/Throat: Mucous membranes are moist.  Neck: No stridor.  No cervical spine tenderness to palpation. There is tenderness to palpation over right trapezius muscle. Full range of motion of neck. Cardiovascular: Normal rate, regular rhythm.  Good peripheral circulation.  Equal radial pulses bilaterally. Respiratory: Normal respiratory effort without tachypnea or retractions. Lungs CTAB. Good air entry to the bases with no decreased or absent breath sounds. Musculoskeletal: Full range of motion to all extremities. No gross deformities appreciated. Neurologic:  Normal speech and language. No gross focal neurologic deficits are appreciated.  Skin:  Skin is warm, dry and intact. No rash noted.   ____________________________________________   LABS (all labs ordered are listed, but only abnormal results are displayed)  Labs Reviewed - No data to  display ____________________________________________  EKG   ____________________________________________  RADIOLOGY  No results found.  ____________________________________________    PROCEDURES  Procedure(s) performed:    Procedures    Medications  orphenadrine (NORFLEX) injection 60 mg (60 mg Intramuscular Given 08/02/17 1938)  ketorolac (TORADOL) 30 MG/ML injection 30 mg (30 mg Intramuscular Given 08/02/17 1938)     ____________________________________________   INITIAL IMPRESSION / ASSESSMENT AND PLAN / ED COURSE  Pertinent labs & imaging results that were available during my care of the patient were reviewed by me and considered in my medical decision making (see chart for details).  Review of the [place] CSRS was performed in accordance of the [facility] prior to dispensing any controlled drugs.    Presented to the emergency department for evaluation of neck pain for 2 days. Pain started after patient fell asleep on the couch. She has full range of motion of her neck. Pain is likely musculoskeletal. She felt better after Toradol and Norflex. Patient will be discharged home with prescriptions for Flexeril and ibuprofen. Patient is to follow up with PCP as directed. Patient is given ED precautions to return to the ED for any worsening or new symptoms.     ____________________________________________  FINAL CLINICAL IMPRESSION(S) / ED DIAGNOSES  Final diagnoses:  Strain of neck muscle, initial encounter      NEW MEDICATIONS STARTED DURING THIS VISIT:  Discharge Medication List as of 08/02/2017  8:10 PM    START taking these medications   Details  cyclobenzaprine (FLEXERIL) 5 MG tablet Take 1 tablet (5 mg total) by mouth 3 (three) times daily as needed for muscle spasms., Starting Tue 08/02/2017, Until Tue 08/09/2017, Print            This chart was dictated using voice recognition software/Dragon. Despite best efforts to proofread, errors can occur which  can change the meaning. Any change was purely unintentional.    Enid Derry, PA-C 08/02/17 2224    Minna Antis, MD 08/02/17 2308

## 2017-08-02 NOTE — ED Triage Notes (Signed)
Pt complains of neck pain for two days, pt denies any other symptoms, pt denies headache

## 2017-12-13 ENCOUNTER — Encounter: Payer: Self-pay | Admitting: Emergency Medicine

## 2017-12-13 ENCOUNTER — Emergency Department
Admission: EM | Admit: 2017-12-13 | Discharge: 2017-12-13 | Disposition: A | Discharge status: Home / Self Care | Payer: Medicaid Other | Attending: Emergency Medicine | Admitting: Emergency Medicine

## 2017-12-13 ENCOUNTER — Other Ambulatory Visit: Payer: Self-pay

## 2017-12-13 DIAGNOSIS — J111 Influenza due to unidentified influenza virus with other respiratory manifestations: Secondary | ICD-10-CM | POA: Insufficient documentation

## 2017-12-13 DIAGNOSIS — R05 Cough: Secondary | ICD-10-CM | POA: Diagnosis present

## 2017-12-13 DIAGNOSIS — Z20818 Contact with and (suspected) exposure to other bacterial communicable diseases: Secondary | ICD-10-CM | POA: Insufficient documentation

## 2017-12-13 DIAGNOSIS — J101 Influenza due to other identified influenza virus with other respiratory manifestations: Secondary | ICD-10-CM

## 2017-12-13 DIAGNOSIS — F17210 Nicotine dependence, cigarettes, uncomplicated: Secondary | ICD-10-CM | POA: Diagnosis not present

## 2017-12-13 LAB — INFLUENZA PANEL BY PCR (TYPE A & B)
INFLAPCR: POSITIVE — AB
INFLBPCR: NEGATIVE

## 2017-12-13 MED ORDER — AZITHROMYCIN 250 MG PO TABS: ORAL_TABLET | ORAL | 0 refills | Status: DC

## 2017-12-13 MED ORDER — OSELTAMIVIR PHOSPHATE 75 MG PO CAPS: 75.0000 mg | ORAL_CAPSULE | Freq: Two times a day (BID) | ORAL | 0 refills | Status: DC

## 2017-12-13 NOTE — ED Triage Notes (Signed)
Pt in via POV with complaints of cough, fever since Saturday, reports being around a family member who was recently diagnosed with the flu.  Pt afebrile upon arrival, vitals WDL, NAD noted at this time.

## 2017-12-13 NOTE — ED Notes (Signed)
Mom c/o cough, headache and sore throat. Was around someone with flu.

## 2017-12-13 NOTE — Discharge Instructions (Signed)
Follow-up with your regular doctor if you are not better in 3 days.  Use medication as prescribed.  Drink plenty of fluids.  Take Tylenol and ibuprofen as needed for fever.  If you are worsening please return to the emergency department
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
## 2018-01-05 ENCOUNTER — Emergency Department
Admission: EM | Admit: 2018-01-05 | Discharge: 2018-01-06 | Disposition: A | Discharge status: Home / Self Care | Payer: Medicaid Other | Attending: Emergency Medicine | Admitting: Emergency Medicine

## 2018-01-05 ENCOUNTER — Emergency Department: Payer: Medicaid Other

## 2018-01-05 ENCOUNTER — Encounter: Payer: Self-pay | Admitting: Emergency Medicine

## 2018-01-05 DIAGNOSIS — Y998 Other external cause status: Secondary | ICD-10-CM | POA: Insufficient documentation

## 2018-01-05 DIAGNOSIS — Y92410 Unspecified street and highway as the place of occurrence of the external cause: Secondary | ICD-10-CM | POA: Insufficient documentation

## 2018-01-05 DIAGNOSIS — Y9389 Activity, other specified: Secondary | ICD-10-CM | POA: Diagnosis not present

## 2018-01-05 DIAGNOSIS — F17210 Nicotine dependence, cigarettes, uncomplicated: Secondary | ICD-10-CM | POA: Diagnosis not present

## 2018-01-05 DIAGNOSIS — M7918 Myalgia, other site: Secondary | ICD-10-CM

## 2018-01-05 LAB — POCT PREGNANCY, URINE: Preg Test, Ur: NEGATIVE
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
## 2018-01-06 MED ORDER — IBUPROFEN 600 MG PO TABS
600.0000 mg | ORAL_TABLET | Freq: Once | ORAL | Status: AC
  Administered 2018-01-06: 600 mg via ORAL
  Filled 2018-01-06: qty 1

## 2018-01-06 MED ORDER — IBUPROFEN 600 MG PO TABS: 600.0000 mg | ORAL_TABLET | Freq: Three times a day (TID) | ORAL | 0 refills | Status: DC | PRN
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
## 2018-01-08 ENCOUNTER — Encounter: Payer: Self-pay | Admitting: Emergency Medicine

## 2018-01-08 ENCOUNTER — Emergency Department
Admission: EM | Admit: 2018-01-08 | Discharge: 2018-01-08 | Disposition: A | Discharge status: Home / Self Care | Payer: Medicaid Other | Attending: Student in an Organized Health Care Education/Training Program | Admitting: Student in an Organized Health Care Education/Training Program

## 2018-01-08 ENCOUNTER — Other Ambulatory Visit: Payer: Self-pay

## 2018-01-08 DIAGNOSIS — Y929 Unspecified place or not applicable: Secondary | ICD-10-CM | POA: Insufficient documentation

## 2018-01-08 DIAGNOSIS — T148XXA Other injury of unspecified body region, initial encounter: Secondary | ICD-10-CM

## 2018-01-08 DIAGNOSIS — Y998 Other external cause status: Secondary | ICD-10-CM | POA: Diagnosis not present

## 2018-01-08 DIAGNOSIS — S46911A Strain of unspecified muscle, fascia and tendon at shoulder and upper arm level, right arm, initial encounter: Secondary | ICD-10-CM | POA: Insufficient documentation

## 2018-01-08 DIAGNOSIS — Y9389 Activity, other specified: Secondary | ICD-10-CM | POA: Insufficient documentation

## 2018-01-08 DIAGNOSIS — F17210 Nicotine dependence, cigarettes, uncomplicated: Secondary | ICD-10-CM | POA: Diagnosis not present

## 2018-01-08 DIAGNOSIS — S4991XA Unspecified injury of right shoulder and upper arm, initial encounter: Secondary | ICD-10-CM | POA: Diagnosis present

## 2018-01-08 MED ORDER — CYCLOBENZAPRINE HCL 10 MG PO TABS: 10.0000 mg | ORAL_TABLET | Freq: Three times a day (TID) | ORAL | 0 refills | Status: DC | PRN
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
## 2018-05-08 LAB — HM HIV SCREENING LAB: HM HIV Screening: NEGATIVE

## 2018-05-25 DIAGNOSIS — F331 Major depressive disorder, recurrent, moderate: Secondary | ICD-10-CM | POA: Insufficient documentation

## 2018-05-25 DIAGNOSIS — F410 Panic disorder [episodic paroxysmal anxiety] without agoraphobia: Secondary | ICD-10-CM | POA: Insufficient documentation

## 2018-09-24 ENCOUNTER — Other Ambulatory Visit: Payer: Self-pay

## 2018-09-24 ENCOUNTER — Encounter: Payer: Self-pay | Admitting: Emergency Medicine

## 2018-09-24 ENCOUNTER — Emergency Department
Admission: EM | Admit: 2018-09-24 | Discharge: 2018-09-24 | Disposition: A | Discharge status: Home / Self Care | Payer: No Typology Code available for payment source | Attending: Emergency Medicine | Admitting: Emergency Medicine

## 2018-09-24 DIAGNOSIS — Y9389 Activity, other specified: Secondary | ICD-10-CM | POA: Insufficient documentation

## 2018-09-24 DIAGNOSIS — Y92410 Unspecified street and highway as the place of occurrence of the external cause: Secondary | ICD-10-CM | POA: Insufficient documentation

## 2018-09-24 DIAGNOSIS — Y999 Unspecified external cause status: Secondary | ICD-10-CM | POA: Insufficient documentation

## 2018-09-24 DIAGNOSIS — S161XXA Strain of muscle, fascia and tendon at neck level, initial encounter: Secondary | ICD-10-CM | POA: Insufficient documentation

## 2018-09-24 DIAGNOSIS — S199XXA Unspecified injury of neck, initial encounter: Secondary | ICD-10-CM | POA: Diagnosis present

## 2018-09-24 DIAGNOSIS — F17210 Nicotine dependence, cigarettes, uncomplicated: Secondary | ICD-10-CM | POA: Insufficient documentation

## 2018-09-24 MED ORDER — IBUPROFEN 600 MG PO TABS: 600.0000 mg | ORAL_TABLET | Freq: Four times a day (QID) | ORAL | 0 refills | Status: DC | PRN

## 2018-09-24 MED ORDER — CYCLOBENZAPRINE HCL 5 MG PO TABS: 5.0000 mg | ORAL_TABLET | Freq: Three times a day (TID) | ORAL | 0 refills | Status: DC | PRN
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
## 2019-05-07 ENCOUNTER — Telehealth: Payer: Self-pay | Admitting: Licensed Clinical Social Worker

## 2019-05-07 NOTE — Telephone Encounter (Signed)
Pt. Left for LCSW desiring an appt.

## 2019-05-22 DIAGNOSIS — F331 Major depressive disorder, recurrent, moderate: Secondary | ICD-10-CM

## 2019-05-22 DIAGNOSIS — F410 Panic disorder [episodic paroxysmal anxiety] without agoraphobia: Secondary | ICD-10-CM

## 2019-05-22 DIAGNOSIS — E663 Overweight: Secondary | ICD-10-CM

## 2019-05-23 ENCOUNTER — Ambulatory Visit (LOCAL_COMMUNITY_HEALTH_CENTER): Payer: Medicaid Other

## 2019-05-23 ENCOUNTER — Other Ambulatory Visit: Payer: Self-pay

## 2019-05-23 VITALS — BP 97/72 | Ht 61.0 in | Wt 162.5 lb

## 2019-05-23 DIAGNOSIS — Z3009 Encounter for other general counseling and advice on contraception: Secondary | ICD-10-CM | POA: Diagnosis not present

## 2019-05-23 DIAGNOSIS — Z3042 Encounter for surveillance of injectable contraceptive: Secondary | ICD-10-CM

## 2019-05-23 DIAGNOSIS — Z30013 Encounter for initial prescription of injectable contraceptive: Secondary | ICD-10-CM | POA: Diagnosis not present

## 2019-05-23 DIAGNOSIS — Z3200 Encounter for pregnancy test, result unknown: Secondary | ICD-10-CM

## 2019-05-23 LAB — PREGNANCY, URINE: Preg Test, Ur: NEGATIVE

## 2019-05-23 MED ORDER — MEDROXYPROGESTERONE ACETATE 150 MG/ML IM SUSP
150.0000 mg | Freq: Once | INTRAMUSCULAR | Status: AC
  Administered 2019-05-23: 150 mg via INTRAMUSCULAR

## 2019-05-23 NOTE — Progress Notes (Signed)
Last physical at [facility] 05/08/2018. Last depo at [facility] 08/23/2018; 38.6 weeks post depo. UPT negative 4/29/202 (was desiring pregnancy). Per verbal order by Jerline Pain, FNP, pt sent for UPT (UPT result negative). Per verbal by K.Leath, FNP, pt given DMPA 150 mg IM today.

## 2019-05-24 ENCOUNTER — Ambulatory Visit: Payer: Self-pay | Admitting: Licensed Clinical Social Worker

## 2019-06-07 ENCOUNTER — Ambulatory Visit: Payer: Medicaid Other | Admitting: Licensed Clinical Social Worker

## 2019-06-07 ENCOUNTER — Telehealth: Payer: Self-pay | Admitting: Licensed Clinical Social Worker

## 2019-06-07 NOTE — Telephone Encounter (Signed)
Spoke with patient to reschedule appointment.

## 2019-06-11 ENCOUNTER — Ambulatory Visit: Payer: Medicaid Other | Admitting: Licensed Clinical Social Worker

## 2019-06-11 DIAGNOSIS — F819 Developmental disorder of scholastic skills, unspecified: Secondary | ICD-10-CM

## 2019-06-11 DIAGNOSIS — F411 Generalized anxiety disorder: Secondary | ICD-10-CM

## 2019-06-11 DIAGNOSIS — F331 Major depressive disorder, recurrent, moderate: Secondary | ICD-10-CM

## 2019-06-11 DIAGNOSIS — F410 Panic disorder [episodic paroxysmal anxiety] without agoraphobia: Secondary | ICD-10-CM

## 2019-06-11 NOTE — Progress Notes (Signed)
Counselor/Therapist Progress Note  Patient ID: Shelley Arias, MRN: [id number],    Date: 06/11/2019  Time Spent: 58 minutes   Treatment Type: Psychotherapy  Reported Symptoms: Panic attacks and PHQ= 12; depression and anxiety   Mental Status Exam:  Appearance:   NA     Behavior:  Appropriate and Sharing  Motor:  NA  Speech/Language:   Normal Rate  Affect:  NA  Mood:  normal  Thought process:  normal  Thought content:    WNL  Sensory/Perceptual disturbances:    WNL  Orientation:  oriented to person, place and time/date  Attention:  Good  Concentration:  Good  Memory:  WNL  Fund of knowledge:   Good  Insight:    Good  Judgment:   Good  Impulse Control:  Good   Risk Assessment: Danger to Self:  No Self-injurious Behavior: No Danger to Others: No Duty to Warn:no Physical Aggression / Violence:No  Access to Firearms a concern: No  Gang Involvement:No   Subjective: Patient was engaged and cooperative throughout the session using time effectively to discuss thoughts and feelings. Patient voices symptoms of anxiety and depression due to conflict with family. Patient voices challenges in relationship with her family and their living situation. Patient voices motivation for treatment and understanding of mood and anxiety issues. Patient is likely to benefit from future treatment because she is motivated to decrease symptoms and improve functioning.    Interventions: Cognitive Behavioral Therapy Established psychological safety. Conducted brief assessment of patient's current symptoms and psychosocial stressors. Provided support through active listening, validation of feelings, and highlighted patient's strengths. Engaged patient in establishing goal of treatment and worked collaboratively to develop CBT treatment plan.    Diagnosis:   ICD-10-CM   1. Generalized anxiety disorder with panic attacks  F41.1    F41.0   2. Major depressive disorder, recurrent episode, moderate  (HCC)  F33.1   3. Learning disabilities  F81.9     Plan: Patient's goal is to learn how to manage anxiety/depression. - Teach the connection between thoughts, emotions, and behaviors -  Explore patient's thoughts, beliefs, automatic thoughts, assumptions  - Process distress and allow for emotional release  - Cognitive reframing  - Questioning and challenging thoughts - Teach distress tolerance techniques - "what help me" - Introduce and teach Mindfulness and mindfulness practices    LCSW and patient will meet for phone-based sessions at this time. LCSW instructed client that she needs to come in to sign another consent prior to our next appointment.   Interpreter used: NA   Milton Ferguson, LCSW

## 2019-06-14 ENCOUNTER — Telehealth: Payer: Self-pay | Admitting: Licensed Clinical Social Worker

## 2019-06-14 NOTE — Telephone Encounter (Signed)
LCSW spoke with patient and informed her to come to [facility] to the receptionist booth and let them know that she is here to sign consents; they will instruct her to the clerical windows and they will provide her with the needed consents - [place] and [place]. Patient agreed that she would follow through with this.

## 2019-06-25 ENCOUNTER — Telehealth: Payer: Self-pay | Admitting: Licensed Clinical Social Worker

## 2019-06-25 ENCOUNTER — Ambulatory Visit: Payer: Medicaid Other | Admitting: Licensed Clinical Social Worker

## 2019-06-25 NOTE — Telephone Encounter (Signed)
Patient left vm on 06/22/2019 informing LCSW that she had not been in to sign consent forms. LCSW returned call on 06/25/2019 and spoke with patient. LCSW instructed patient that she needed to sign the consents prior to next appointment. Patient agreed that she would try to make this happen - but transportation is an issues.

## 2019-06-27 ENCOUNTER — Ambulatory Visit: Payer: Medicaid Other | Admitting: Licensed Clinical Social Worker

## 2019-06-27 DIAGNOSIS — F410 Panic disorder [episodic paroxysmal anxiety] without agoraphobia: Secondary | ICD-10-CM

## 2019-06-27 DIAGNOSIS — F411 Generalized anxiety disorder: Secondary | ICD-10-CM

## 2019-06-27 DIAGNOSIS — F819 Developmental disorder of scholastic skills, unspecified: Secondary | ICD-10-CM

## 2019-06-27 DIAGNOSIS — F331 Major depressive disorder, recurrent, moderate: Secondary | ICD-10-CM

## 2019-06-27 NOTE — Progress Notes (Signed)
Counselor/Therapist Progress Note  Patient ID: Shelley Arias, MRN: [id number],    Date: 06/27/2019  Time Spent: 45 minutes    Treatment Type: Psychotherapy  Reported Symptoms: Feelings of Worthlessness, Hopelessness, Verbal aggression and depressed mood, irritability   Mental Status Exam:   Appearance:   NA     Behavior:  Appropriate and Sharing  Motor:  NA  Speech/Language:   Normal Rate  Affect:  NA  Mood:  normal  Thought process:  normal  Thought content:    WNL  Sensory/Perceptual disturbances:    WNL  Orientation:  oriented to person, place and time/date  Attention:  Good  Concentration:  Good  Memory:  WNL  Fund of knowledge:   Good  Insight:    Good  Judgment:   Good  Impulse Control:  Good   Risk Assessment: Danger to Self:  No Self-injurious Behavior: No Danger to Others: No Duty to Warn:no Physical Aggression / Violence:No  Access to Firearms a concern: No  Gang Involvement:No   Subjective: Patient was engaged and cooperative throughout the session using time effectively to discuss thoughts and feelings. Patient voices continued anxiety/depression symptoms due to multiple stressors.  Patient is likely to benefit from future treatment because she remains motivated to decrease anxiety and depression and reports benefit of regular sessions in addressing these symptoms.   Interventions: Cognitive Behavioral Therapy  Established psychological safety. Provided supportive space encouraging emotional release and processing of current psychosocial stressors. Assisted patient in identifying activities to engage in that she has previously found benefit from.  Provided support through active listening, validation of feelings, and highlighted patient's strengths.    Diagnosis:   ICD-10-CM   1. Generalized anxiety disorder with panic attacks  F41.1    F41.0   2. Major depressive disorder, recurrent episode, moderate (HCC)  F33.1   3. Learning disabilities  F81.9      Plan: Continue to use CBTs to address patient's goal of treatment to increase coping skills to manage symptoms. LCSW and patient will continue phone-based sessions at this time.   Interpreter used: NA   Milton Ferguson, LCSW

## 2019-07-04 ENCOUNTER — Ambulatory Visit: Payer: Medicaid Other | Admitting: Licensed Clinical Social Worker

## 2019-07-04 DIAGNOSIS — F331 Major depressive disorder, recurrent, moderate: Secondary | ICD-10-CM

## 2019-07-04 DIAGNOSIS — F410 Panic disorder [episodic paroxysmal anxiety] without agoraphobia: Secondary | ICD-10-CM

## 2019-07-04 DIAGNOSIS — F411 Generalized anxiety disorder: Secondary | ICD-10-CM

## 2019-07-04 NOTE — Progress Notes (Signed)
Counselor/Therapist Progress Note  Patient ID: Shelley Arias, MRN: [id number],    Date: 07/04/2019  Time Spent: 50 minutes  Treatment Type: Psychotherapy  Reported Symptoms: Feelings of Worthlessness, Hopelessness and depressed mood   Mental Status Exam:  Appearance:   NA     Behavior:  Appropriate and Sharing  Motor:  NA  Speech/Language:   Normal Rate  Affect:  NA  Mood:  normal  Thought process:  normal  Thought content:    WNL  Sensory/Perceptual disturbances:    WNL  Orientation:  oriented to person, place and time/date  Attention:  Good  Concentration:  Good  Memory:  WNL  Fund of knowledge:   Good  Insight:    Good  Judgment:   Good  Impulse Control:  Good   Risk Assessment: Danger to Self:  No Self-injurious Behavior: No Danger to Others: No Duty to Warn:no Physical Aggression / Violence:No  Access to Firearms a concern: No  Gang Involvement:No   Subjective: Patient was engaged and cooperative throughout the session using time effectively to discuss thoughts and feelings. Patient continues to voice depression and anxiety symptoms and reports multiple psychosocial stressors with housing being a major stressor for patient. Patient voices continued motivation for treatment and is likely to benefit from future treatment because she remains motivated to decrease symptoms and improve functioning and reports benefit of regular sessions. Patient will likely benefit from a medication management evaluation.    Interventions: Cognitive Behavioral Therapy  Established psychological safety. Engaged patient in processing current psychosocial stressors - continued patterns of anxiety and depressed mood due to multiple stressors, including challenging relationships with family whom she lives with. Discussed options for patient to be evaluated for medication management. Explored patient's options for securing housing, assisting her in developing a plan to seek out resources and  to begin saving money. Encouraged patient to continue to engage in pleasurable activities, including listening to music and getting out of the house to get something to eat. Provided support through active listening, validation of feelings, and highlighted patient's strengths.    Diagnosis:   ICD-10-CM   1. Generalized anxiety disorder with panic attacks  F41.1    F41.0   2. Major depressive disorder, recurrent episode, moderate (HCC)  F33.1     Plan: Continue CBTs working on patient's goal to mange symptoms of anxiety/depression. Continue phone-based sessions with a candance of every two weeks at this time.   Interpreter used: NA   Milton Ferguson, LCSW

## 2019-07-18 ENCOUNTER — Ambulatory Visit: Payer: Medicaid Other | Admitting: Licensed Clinical Social Worker

## 2019-07-18 DIAGNOSIS — F331 Major depressive disorder, recurrent, moderate: Secondary | ICD-10-CM

## 2019-07-18 DIAGNOSIS — F411 Generalized anxiety disorder: Secondary | ICD-10-CM

## 2019-07-18 DIAGNOSIS — F410 Panic disorder [episodic paroxysmal anxiety] without agoraphobia: Secondary | ICD-10-CM
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
## 2019-08-01 ENCOUNTER — Ambulatory Visit: Payer: Medicaid Other | Admitting: Licensed Clinical Social Worker

## 2019-08-01 DIAGNOSIS — F410 Panic disorder [episodic paroxysmal anxiety] without agoraphobia: Secondary | ICD-10-CM

## 2019-08-01 DIAGNOSIS — F331 Major depressive disorder, recurrent, moderate: Secondary | ICD-10-CM

## 2019-08-01 DIAGNOSIS — F411 Generalized anxiety disorder: Secondary | ICD-10-CM

## 2019-08-01 NOTE — Progress Notes (Signed)
Counselor/Therapist Progress Note  Patient ID: Shelley Arias, MRN: [id number],    Date: 08/01/2019  Time Spent: 35 minutes    Treatment Type: Psychotherapy  Reported Symptoms: anxiety, irritability  Mental Status Exam:  Appearance:   NA     Behavior:  Appropriate and Sharing  Motor:  NA  Speech/Language:   Normal Rate  Affect:  NA  Mood:  normal  Thought process:  normal  Thought content:    WNL  Sensory/Perceptual disturbances:    WNL  Orientation:  oriented to person, place and time/date  Attention:  Good  Concentration:  Good  Memory:  WNL  Fund of knowledge:   Good  Insight:    Good  Judgment:   Good  Impulse Control:  Good   Risk Assessment: Danger to Self:  No Self-injurious Behavior: No Danger to Others: No Duty to Warn:no Physical Aggression / Violence:No  Access to Firearms a concern: No  Gang Involvement:No   Subjective: Patient was engaged and cooperative throughout the session using time effectively to discuss thoughts and feelings. Patient voices continued motivation for treatment and understanding of depression and anxiety. Patient is likely to benefit from future treatment because she remains motivated to decrease symptoms and reports benefit of regular sessions in addressing these symptoms.    Interventions: Cognitive Behavioral Therapy  Established psychological safety. Provided supportive space encouraging emotional release and processing of current psychosocial stressors. Discussed patient's challenges with emotional regulation, identifying points of intervention. Charted out patient's thoughts, emotions and behaviors and encouraged patient to notice thoughts and physical sensations. Provided support through active listening, validation of feelings, and highlighted patient's strengths.   Diagnosis:   ICD-10-CM   1. Generalized anxiety disorder with panic attacks  F41.1    F41.0   2. Major depressive disorder, recurrent episode, moderate (HCC)   F33.1     Plan: Continue to use CBTs to address patient's goal of treatment to increase coping skills to manage symptoms. LCSW and patient will continue phone-based sessions at this time.   Future Appointments  Date Time Provider [place]  08/15/2019  3:00 PM Milton Ferguson, LCSW [facility] None    Interpreter used: NA   Milton Ferguson, LCSW

## 2019-08-08 ENCOUNTER — Other Ambulatory Visit: Payer: Self-pay

## 2019-08-08 ENCOUNTER — Ambulatory Visit (LOCAL_COMMUNITY_HEALTH_CENTER): Payer: Medicaid Other

## 2019-08-08 VITALS — BP 100/69 | Ht 61.0 in | Wt 164.0 lb

## 2019-08-08 DIAGNOSIS — Z3042 Encounter for surveillance of injectable contraceptive: Secondary | ICD-10-CM

## 2019-08-08 DIAGNOSIS — Z23 Encounter for immunization: Secondary | ICD-10-CM

## 2019-08-08 DIAGNOSIS — Z30013 Encounter for initial prescription of injectable contraceptive: Secondary | ICD-10-CM

## 2019-08-08 DIAGNOSIS — Z3009 Encounter for other general counseling and advice on contraception: Secondary | ICD-10-CM | POA: Diagnosis not present

## 2019-08-08 MED ORDER — MEDROXYPROGESTERONE ACETATE 150 MG/ML IM SUSP
150.0000 mg | Freq: Once | INTRAMUSCULAR | Status: AC
  Administered 2019-08-08: 150 mg via INTRAMUSCULAR

## 2019-08-08 NOTE — Progress Notes (Signed)
Last physical at [facility] 05/08/2018. Last depo at [facility] 05/23/2019; 11.0 weeks post depo. Discussed pt details and pt request to continue with depo with provider. Per verbal order by Antoine Primas, PA, pt can have DMPA 150 mg IM x 2 (1st injection today).

## 2019-08-15 ENCOUNTER — Ambulatory Visit: Payer: Medicaid Other | Admitting: Licensed Clinical Social Worker

## 2019-08-15 DIAGNOSIS — F411 Generalized anxiety disorder: Secondary | ICD-10-CM

## 2019-08-15 DIAGNOSIS — F410 Panic disorder [episodic paroxysmal anxiety] without agoraphobia: Secondary | ICD-10-CM

## 2019-08-15 DIAGNOSIS — F819 Developmental disorder of scholastic skills, unspecified: Secondary | ICD-10-CM

## 2019-08-15 DIAGNOSIS — F331 Major depressive disorder, recurrent, moderate: Secondary | ICD-10-CM

## 2019-08-15 NOTE — Progress Notes (Signed)
Counselor/Therapist Progress Note  Patient ID: Shelley Arias, MRN: [id number],    Date: 08/15/2019  Time Spent: 20 minutes   Treatment Type: Psychotherapy  Reported Symptoms: overall mood stability  Mental Status Exam:  Appearance:   NA     Behavior:  Appropriate and Sharing  Motor:  NA  Speech/Language:   Normal Rate  Affect:  NA  Mood:  normal  Thought process:  normal  Thought content:    WNL  Sensory/Perceptual disturbances:    WNL  Orientation:  oriented to person, place and time/date  Attention:  Good  Concentration:  Good  Memory:  WNL  Fund of knowledge:   Good  Insight:    Good  Judgment:   Good  Impulse Control:  Good   Risk Assessment: Danger to Self:  No Self-injurious Behavior: No Danger to Others: No Duty to Warn:no Physical Aggression / Violence:No  Access to Firearms a concern: No  Gang Involvement:No   Subjective: Patient was engaged and cooperative throughout the session using time effectively to discuss thoughts and feelings. Patient voices continued motivation for treatment and understanding of mood and anxiety issues. Patient is likely to benefit from future treatment because she remains motivated to manage symptoms and improve functioning.    Interventions: Cognitive Behavioral Therapy  Established psychological safety. Checked in with patient regarding current mood and psychosocial stressors - increased hope and decrease in mood and anxiety symptoms. Discussed with patient her plan to apply for SSI for her learning disability. Provided support through active listening, validation of feelings, and highlighted patient's strengths.   Diagnosis:   ICD-10-CM   1. Generalized anxiety disorder with panic attacks  F41.1    F41.0   2. Major depressive disorder, recurrent episode, moderate (HCC)  F33.1   3. Learning disabilities  F81.9     Plan: Continue to use CBTs to address patient's goal of treatment to increase coping skills to manage  symptoms. LCSW and patient will continue phone-based sessions at this time.  Interpreter used: NA   Milton Ferguson, LCSW

## 2019-08-28 ENCOUNTER — Ambulatory Visit: Payer: Medicaid Other | Admitting: Licensed Clinical Social Worker

## 2019-08-28 DIAGNOSIS — F331 Major depressive disorder, recurrent, moderate: Secondary | ICD-10-CM

## 2019-08-28 DIAGNOSIS — F819 Developmental disorder of scholastic skills, unspecified: Secondary | ICD-10-CM

## 2019-08-28 DIAGNOSIS — F411 Generalized anxiety disorder: Secondary | ICD-10-CM

## 2019-08-28 DIAGNOSIS — F410 Panic disorder [episodic paroxysmal anxiety] without agoraphobia: Secondary | ICD-10-CM

## 2019-08-28 NOTE — Progress Notes (Signed)
Counselor/Therapist Progress Note  Patient ID: Shelley Arias, MRN: [id number],    Date: 08/28/2019  Time Spent:  45 minutes   Treatment Type: Psychotherapy  Reported Symptoms: Anhedonia and low mood; anxious thoughts   Mental Status Exam:  Appearance:   NA     Behavior:  Appropriate and Sharing  Motor:  NA  Speech/Language:   Normal Rate  Affect:  Appropriate  Mood:  sad  Thought process:  normal  Thought content:    WNL  Sensory/Perceptual disturbances:    WNL  Orientation:  oriented to person, place, time/date and situation  Attention:  Good  Concentration:  Good  Memory:  WNL  Fund of knowledge:   Good  Insight:    Good  Judgment:   Good  Impulse Control:  Good   Risk Assessment: Danger to Self:  No Self-injurious Behavior: No Danger to Others: No Duty to Warn:no Physical Aggression / Violence:No  Access to Firearms a concern: No  Gang Involvement:No   Subjective: Patient was engaged and cooperative throughout the session using time effectively to discuss thoughts and feelings. Patient voices continued motivation for treatment and understanding of mood and anxiety challenges. Patient is likely to benefit from future treatment because she remains motivated to decrease symptoms and improve functioning and reports benefit of regular sessions in addressing these symptoms.    Interventions: Cognitive Behavioral Therapy  Established psychological safety. Provided supportive space encouraging emotional release and processing of current psychosocial stressors, continued anxiety and depressive symptoms; self-esteem issues due to disability. Explored patient's self-perception, challenging her negative view of self encouraging her to notice and identify positive attributes and to change negative self-talk. Reviewed the importance of patient engaging in pleasurable activities to care for herself. Provided information on housing resources. Provided support through active  listening, validation of feelings, and highlighted patient's strengths.   Diagnosis:   ICD-10-CM   1. Generalized anxiety disorder with panic attacks  F41.1    F41.0   2. Major depressive disorder, recurrent episode, moderate (HCC)  F33.1   3. Learning disabilities  F81.9     Plan: Continue to use CBTs to address patient's goal of treatment to increase coping skills to manage symptoms. LCSW and patient will continue phone-based sessions at this time.  Future Appointments  Date Time Provider [place]  09/12/2019  3:00 PM Milton Ferguson, LCSW [facility] None   Interpreter used: NA   Milton Ferguson, LCSW

## 2019-09-12 ENCOUNTER — Ambulatory Visit: Payer: Medicaid Other | Admitting: Licensed Clinical Social Worker

## 2019-09-12 DIAGNOSIS — F411 Generalized anxiety disorder: Secondary | ICD-10-CM

## 2019-09-12 DIAGNOSIS — F331 Major depressive disorder, recurrent, moderate: Secondary | ICD-10-CM

## 2019-09-12 DIAGNOSIS — F819 Developmental disorder of scholastic skills, unspecified: Secondary | ICD-10-CM

## 2019-09-12 DIAGNOSIS — F410 Panic disorder [episodic paroxysmal anxiety] without agoraphobia: Secondary | ICD-10-CM

## 2019-09-12 NOTE — Progress Notes (Signed)
Counselor/Therapist Progress Note  Patient ID: TIERRIA WATSON, MRN: [id number],    Date: 09/12/2019  Time Spent: 30 minutes    Treatment Type: Psychotherapy  Reported Symptoms: Static symptoms- overall stable with continued moments of irritability, low mood   Mental Status Exam:  Appearance:   NA     Behavior:  Appropriate and Sharing  Motor:  Normal  Speech/Language:   Normal Rate  Affect:  Appropriate  Mood:  normal  Thought process:  normal  Thought content:    WNL  Sensory/Perceptual disturbances:    WNL  Orientation:  oriented to person, place, time/date and situation  Attention:  Good  Concentration:  Good  Memory:  WNL  Fund of knowledge:   Good  Insight:    Good  Judgment:   Good  Impulse Control:  Good   Risk Assessment: Danger to Self:  No Self-injurious Behavior: No Danger to Others: No Duty to Warn:no Physical Aggression / Violence:No  Access to Firearms a concern: No  Gang Involvement:No   Subjective: Patient was engaged and cooperative throughout the session using time effectively to discuss thoughts and feelings. Patient voices continued motivation for treatment and understanding of depression and anxiety issues. Patient is likely to benefit from future treatment because she remains motivated to decrease mood and anxiety symptoms  and reports benefit of regular sessions in addressing these symptoms.    Interventions: Cognitive Behavioral Therapy  Established psychological safety. Engaged patient in processing current psychosocial stressors - overall static symptoms. Actively listened to patient while she expressed her concerns regarding family relationships, providing empathetic refection. Discussed boundaries and assertive communication and barriers to these stratigies. Facilitated discussion regarding patient's goal and plans to move out. Provided support through active listening, validation of feelings, and highlighted patient's strengths.    Diagnosis:   ICD-10-CM   1. Generalized anxiety disorder with panic attacks  F41.1    F41.0   2. Major depressive disorder, recurrent episode, moderate (HCC)  F33.1   3. Learning disabilities  F81.9     Plan: Continue to use CBTs to address patient's goal of treatment to increase coping skills to manage symptoms. LCSW and patient will continue phone-based sessions at this time.  Interpreter used: NA   Milton Ferguson, LCSW

## 2019-09-26 ENCOUNTER — Ambulatory Visit: Payer: Medicaid Other | Admitting: Licensed Clinical Social Worker

## 2019-10-03 ENCOUNTER — Ambulatory Visit: Payer: Medicaid Other | Admitting: Licensed Clinical Social Worker

## 2019-10-03 DIAGNOSIS — F410 Panic disorder [episodic paroxysmal anxiety] without agoraphobia: Secondary | ICD-10-CM

## 2019-10-03 DIAGNOSIS — F331 Major depressive disorder, recurrent, moderate: Secondary | ICD-10-CM

## 2019-10-03 DIAGNOSIS — F411 Generalized anxiety disorder: Secondary | ICD-10-CM

## 2019-10-03 NOTE — Progress Notes (Signed)
Counselor/Therapist Progress Note  Patient ID: Shelley Arias, MRN: [id number],    Date: 10/03/2019  Time Spent: 33 minutes    Treatment Type: Individual Therapy  Reported Symptoms: Anhedonia, Verbal aggression and low mood; anxiety, anxious thought patterns   Mental Status Exam:   Appearance:   NA     Behavior:  Appropriate and Sharing  Motor:  Normal  Speech/Language:   Normal Rate  Affect:  NA  Mood:  normal  Thought process:  normal  Thought content:    WNL  Sensory/Perceptual disturbances:    WNL  Orientation:  oriented to person, place, time/date and situation  Attention:  Good  Concentration:  Good  Memory:  WNL  Fund of knowledge:   Good  Insight:    Good  Judgment:   Good  Impulse Control:  Good   Risk Assessment: Danger to Self:  No Self-injurious Behavior: No Danger to Others: No Duty to Warn:no Physical Aggression / Violence:No  Access to Firearms a concern: No  Gang Involvement:No   Subjective: Patient was engaged and cooperative throughout the session using time effectively to discuss thoughts and feelings. Patient voices continued motivation for treatment and understanding of depression and anxiety symptoms related to stressors. Patient is likely to benefit from future treatment because  remains motivated to manage symptoms and reports benefit of regular sessions in addressing symptoms.   Interventions: Cognitive Behavioral Therapy  Established psychological safety. Engaged patient in processing current psychosocial stressors - continued depression and anxiety due to family conflict. Validated patient's feelings of sadness and anger, identifying origins of these feelings within her family relationships. Discussed options for patient to get her own housing; also discussed unemployment. Provided supportive space encouraging emotional release and processing of current psychosocial stressors   Diagnosis:   ICD-10-CM   1. Generalized anxiety disorder with  panic attacks  F41.1    F41.0   2. Major depressive disorder, recurrent episode, moderate (HCC)  F33.1     Plan: Continue to use CBTs to address patient's goal of treatment - to increase coping skills to manage symptoms. LCSW and patient will continue phone-based sessions at this time.   Future Appointments  Date Time Provider [place]  10/16/2019  4:00 PM Milton Ferguson, LCSW [facility] None     Interpreter used: NA   Milton Ferguson, [place]

## 2019-10-16 ENCOUNTER — Ambulatory Visit: Payer: Medicaid Other | Admitting: Licensed Clinical Social Worker

## 2019-10-16 DIAGNOSIS — F819 Developmental disorder of scholastic skills, unspecified: Secondary | ICD-10-CM

## 2019-10-16 DIAGNOSIS — F411 Generalized anxiety disorder: Secondary | ICD-10-CM

## 2019-10-16 DIAGNOSIS — F410 Panic disorder [episodic paroxysmal anxiety] without agoraphobia: Secondary | ICD-10-CM

## 2019-10-16 DIAGNOSIS — F331 Major depressive disorder, recurrent, moderate: Secondary | ICD-10-CM

## 2019-10-16 NOTE — Progress Notes (Signed)
Counselor/Therapist Progress Note  Patient ID: Shelley Arias, MRN: [id number],    Date: 10/16/2019  Time Spent: 40 minutes   Treatment Type: Individual Therapy  Reported Symptoms: Anhedonia, Verbal aggression and low mood, anxiety, anxious thought patterns  Mental Status Exam:  Appearance:   NA     Behavior:  Appropriate and Sharing  Motor:  Normal  Speech/Language:   NA  Affect:  NA  Mood:  normal  Thought process:  normal  Thought content:    WNL  Sensory/Perceptual disturbances:    WNL  Orientation:  oriented to person, place, time/date and situation  Attention:  Good  Concentration:  Good  Memory:  WNL  Fund of knowledge:   Good  Insight:    Good  Judgment:   Good  Impulse Control:  Good   Risk Assessment: Danger to Self:  No Self-injurious Behavior: No Danger to Others: No Duty to Warn:no Physical Aggression / Violence:No  Access to Firearms a concern: No  Gang Involvement:No   Subjective: Patient was engaged and cooperative throughout the session using time effectively to discuss thoughts and feelings. Patient voices continued motivation for treatment and understanding of depression and anxiety issues. Patient is likely to benefit from future treatment because she remains motivated to decrease anxiety/depression and reports benefit of regular sessions in addressing these symptoms.  Interventions: Cognitive Behavioral Therapy  Established psychological safety. Provided supportive space encouraging emotional release and processing of current psychosocial stressors- continued depression/anxiety symptoms; challenges in living situation with family. Discussed patient's plan to secure her own housing helping her to identify and plan for expenses. Assisted patient in identifying stress reduction techniques, including listening to music, and looking at funny video's. Encouraged patient to continue to engage in positive activities with supportive family members. Provided  support through active listening, validation of feelings, and highlighted patient's strengths.   Diagnosis:   ICD-10-CM   1. Generalized anxiety disorder with panic attacks  F41.1    F41.0   2. Major depressive disorder, recurrent episode, moderate (HCC)  F33.1   3. Learning disabilities  F81.9     Plan: Continue to use CBTs to address patient's goal of treatment - to increase coping skills to manage symptoms. LCSW and patient will continue phone-based sessions at this time.  Future Appointments  Date Time Provider [place]  10/30/2019  4:00 PM Milton Ferguson, LCSW [facility] None    Interpreter used: NA   Milton Ferguson, LCSW

## 2019-10-29 ENCOUNTER — Other Ambulatory Visit: Payer: Self-pay

## 2019-10-29 ENCOUNTER — Ambulatory Visit (LOCAL_COMMUNITY_HEALTH_CENTER): Payer: Medicaid Other

## 2019-10-29 VITALS — BP 108/62 | Ht 61.0 in | Wt 171.5 lb

## 2019-10-29 DIAGNOSIS — Z3042 Encounter for surveillance of injectable contraceptive: Secondary | ICD-10-CM

## 2019-10-29 DIAGNOSIS — Z3009 Encounter for other general counseling and advice on contraception: Secondary | ICD-10-CM

## 2019-10-29 DIAGNOSIS — Z30013 Encounter for initial prescription of injectable contraceptive: Secondary | ICD-10-CM

## 2019-10-29 MED ORDER — MEDROXYPROGESTERONE ACETATE 150 MG/ML IM SUSP
150.0000 mg | Freq: Once | INTRAMUSCULAR | Status: AC
Start: 2019-10-29 — End: 2019-10-29
  Administered 2019-10-29: 16:00:00 150 mg via INTRAMUSCULAR

## 2019-10-29 NOTE — Progress Notes (Signed)
Last physical at [facility] July 2019. Last depo at [facility] 08/08/2019; 11.5 weeks post depo today. DMPA 150 mg IM administered today per Sadie Haber, PA order dated 08/08/2019. Pt to schedule physical when next depo is due.

## 2019-10-30 ENCOUNTER — Ambulatory Visit: Payer: Medicaid Other | Admitting: Licensed Clinical Social Worker

## 2019-10-30 DIAGNOSIS — F410 Panic disorder [episodic paroxysmal anxiety] without agoraphobia: Secondary | ICD-10-CM

## 2019-10-30 DIAGNOSIS — F331 Major depressive disorder, recurrent, moderate: Secondary | ICD-10-CM

## 2019-10-30 DIAGNOSIS — F819 Developmental disorder of scholastic skills, unspecified: Secondary | ICD-10-CM

## 2019-10-30 NOTE — Progress Notes (Signed)
Counselor/Therapist Progress Note  Patient ID: TACORI KVAMME, MRN: [id number],    Date: 10/30/2019  Time Spent: 20 minutes    Treatment Type: Individual Therapy  Reported Symptoms: overall stable mood, mild depression and anxiety symptoms secondary to family conflict   Mental Status Exam:  Appearance:   NA     Behavior:  Appropriate  Motor:  Normal  Speech/Language:   Normal Rate  Affect:  NA  Mood:  normal  Thought process:  normal  Thought content:    WNL  Sensory/Perceptual disturbances:    WNL  Orientation:  oriented to person, place, time/date and situation  Attention:  Good  Concentration:  Good  Memory:  WNL  Fund of knowledge:   Good  Insight:    Good  Judgment:   Good  Impulse Control:  Good   Risk Assessment: Danger to Self:  No Self-injurious Behavior: No Danger to Others: No Duty to Warn:no Physical Aggression / Violence:No  Access to Firearms a concern: No  Gang Involvement:No   Subjective: Patient was engaged and cooperative throughout the session using time effectively to discuss thoughts, feelings, and termination of services. Patient voices that she has met her goal of treatment and is in agreement to discontinue services at this time. Patient reports benefit from sessions - voicing benefit from feeling heard and processing challenging family circumstances.    Interventions: Cognitive Behavioral Therapy  Established psychological safety. Checked in with patient and reviewed progress towards goal of treatment. Reviewed coping strategies, including listening to music and spending time with positive peers and family supports. Discussed termination and encouraged patient to seek services in future as needed.   Diagnosis:   ICD-10-CM   1. Generalized anxiety disorder with panic attacks  F41.1    F41.0   2. Major depressive disorder, recurrent episode, moderate (HCC)  F33.1   3. Learning disabilities  F81.9     Plan: Patient to seek services in the  future.  No future appointments.   Interpreter used: NA   Milton Ferguson, LCSW

## 2020-01-21 ENCOUNTER — Other Ambulatory Visit: Payer: Self-pay | Admitting: Physician Assistant

## 2020-01-21 DIAGNOSIS — Z3042 Encounter for surveillance of injectable contraceptive: Secondary | ICD-10-CM

## 2020-01-21 MED ORDER — MEDROXYPROGESTERONE ACETATE 150 MG/ML IM SUSP: 150.0000 mg | Freq: Once | INTRAMUSCULAR | Status: DC

## 2020-01-21 NOTE — Progress Notes (Signed)
Per chart review, last RP 04/2018 and last pap 2019 was unsatisfactory.  Needs repeat pap.  CBE due 2022. Provided that patient desires to continue with Depo and BP is normal, may continue with Depo.  Will need a provider visit for pap either before and when next Depo will be due in June.

## 2020-01-22 ENCOUNTER — Ambulatory Visit: Payer: Medicaid Other

## 2020-02-05 ENCOUNTER — Ambulatory Visit: Payer: Medicaid Other

## 2020-03-10 ENCOUNTER — Ambulatory Visit: Payer: Medicaid Other

## 2020-07-02 ENCOUNTER — Telehealth: Payer: Self-pay

## 2020-07-02 NOTE — Telephone Encounter (Signed)
Pt calling; has been off depo approx 24m; is feeling weird, stomach feels funny, breasts are very sensitive; neg preg test; doesn't understand what's going on.  [phone]  Pt aware I cannot adv her as we haven't seen her before.  To call facility that gave her the depo.

## 2020-09-25 ENCOUNTER — Other Ambulatory Visit: Payer: Self-pay

## 2020-09-25 ENCOUNTER — Ambulatory Visit
Admission: EM | Admit: 2020-09-25 | Discharge: 2020-09-25 | Disposition: A | Discharge status: Home / Self Care | Payer: Medicaid Other | Attending: Physician Assistant | Admitting: Physician Assistant

## 2020-09-25 ENCOUNTER — Ambulatory Visit (INDEPENDENT_AMBULATORY_CARE_PROVIDER_SITE_OTHER): Payer: Medicaid Other

## 2020-09-25 ENCOUNTER — Encounter: Payer: Self-pay | Admitting: Emergency Medicine

## 2020-09-25 DIAGNOSIS — R0981 Nasal congestion: Secondary | ICD-10-CM | POA: Diagnosis present

## 2020-09-25 DIAGNOSIS — R0789 Other chest pain: Secondary | ICD-10-CM | POA: Diagnosis not present

## 2020-09-25 DIAGNOSIS — R079 Chest pain, unspecified: Secondary | ICD-10-CM

## 2020-09-25 DIAGNOSIS — U071 COVID-19: Secondary | ICD-10-CM

## 2020-09-25 LAB — RESP PANEL BY RT-PCR (FLU A&B, COVID) ARPGX2
Influenza A by PCR: NEGATIVE
Influenza B by PCR: NEGATIVE
SARS Coronavirus 2 by RT PCR: POSITIVE — AB

## 2020-09-25 MED ORDER — IBUPROFEN 600 MG PO TABS: 600.0000 mg | ORAL_TABLET | Freq: Four times a day (QID) | ORAL | 0 refills | Status: DC | PRN

## 2020-09-25 MED ORDER — KETOROLAC TROMETHAMINE 60 MG/2ML IM SOLN
60.0000 mg | Freq: Once | INTRAMUSCULAR | Status: AC
  Administered 2020-09-25: 60 mg via INTRAMUSCULAR

## 2020-09-25 MED ORDER — IBUPROFEN 600 MG PO TABS: 600.0000 mg | ORAL_TABLET | Freq: Four times a day (QID) | ORAL | 0 refills | Status: AC | PRN

## 2020-09-25 MED ORDER — PSEUDOEPH-BROMPHEN-DM 30-2-10 MG/5ML PO SYRP: 10.0000 mL | ORAL_SOLUTION | Freq: Four times a day (QID) | ORAL | 0 refills | Status: AC | PRN

## 2020-09-25 NOTE — ED Triage Notes (Signed)
Pt c/o chest pain and shortness of breath. Started about 2 days ago. She states her sister called ems this morning and told her everything looked normal. She has not tried anything. She states the pain is worse when she walks. Denies nausea, vomiting or sweats.

## 2020-09-25 NOTE — ED Provider Notes (Signed)
[facility]    CSN: [id number] Arrival date & time: 09/25/20  1407      History   Chief Complaint Chief Complaint  Patient presents with  . Chest Pain    HPI Shelley Arias is a 29 y.o. female presenting for 2-day history of diffuse chest pain and increased pain on breathing. Admits to 1 day history of nasal congestion. Patient states that she did do a lot of heavy lifting with a family member the day before symptoms started.  States she was lifting a lot of heavy boxes.  Patient states she feels like she is short of breath at times.  She denies any wheezing.  She denies any palpitations, dizziness, sweats.  Denies any fever or cough.  Denies any abdominal pain, nausea/vomiting or diarrhea.  Patient has no history of any cardiopulmonary disease.  She denies any history of blood clotting disorders and denies any history of DVT or PE.  Denies any known Covid exposure, and has not been vaccinated for COVID-19.  Has not taken any medication for symptoms.  Medical history significant for anxiety, depression, and obesity.  No other medical conditions.  No other concerns today.  HPI  History reviewed. No pertinent past medical history.  Patient Active Problem List   Diagnosis Date Noted  . Generalized anxiety disorder with panic attacks 05/25/2018  . Major depressive disorder, recurrent episode, moderate (HCC) 05/25/2018  . Overweight 09/06/2014    Past Surgical History:  Procedure Laterality Date  . CESAREAN SECTION      OB History    Gravida  1   Para      Term      Preterm      AB      Living  1     SAB      TAB      Ectopic      Multiple      Live Births               Home Medications    Prior to Admission medications   Medication Sig Start Date End Date Taking? Authorizing Provider  Aspirin-Salicylamide-Caffeine (BC HEADACHE POWDER PO) Take 1 Package by mouth as needed.    [provider]  brompheniramine-pseudoephedrine-DM  30-2-10 MG/5ML syrup Take 10 mLs by mouth 4 (four) times daily as needed for up to 7 days. 09/25/20 10/02/20  Eusebio Friendly B, PA-C  cyclobenzaprine (FLEXERIL) 5 MG tablet Take 1 tablet (5 mg total) by mouth 3 (three) times daily as needed for muscle spasms. Patient not taking: Reported on 05/23/2019 09/24/18   Minna Antis, MD  ibuprofen (ADVIL) 600 MG tablet Take 1 tablet (600 mg total) by mouth every 6 (six) hours as needed for up to 10 days. 09/25/20 10/05/20  Eusebio Friendly B, PA-C  Multiple Vitamins-Minerals (HAIR/SKIN/NAILS) CAPS Take 1 tablet by mouth 2 (two) times daily.    [provider]    Family History Family History  Problem Relation Age of Onset  . Diabetes Maternal Grandfather   . Heart disease Maternal Grandfather   . Hypertension Maternal Grandfather   . Diabetes Paternal Grandmother   . Heart disease Paternal Grandmother   . Hypertension Paternal Grandmother   . Diabetes Paternal Grandfather   . Heart disease Paternal Grandfather   . Hypertension Paternal Grandfather     Social History Social History   Tobacco Use  . Smoking status: Former Smoker    Packs/day: 0.25    Types: Cigarettes  .  Smokeless tobacco: Never Used  . Tobacco comment: Quit last week in Dec 2020  Vaping Use  . Vaping Use: Never used  Substance Use Topics  . Alcohol use: No  . Drug use: No     Allergies   Penicillins   Review of Systems Review of Systems  Constitutional: Negative for chills, diaphoresis, fatigue and fever.  HENT: Positive for congestion and rhinorrhea. Negative for ear pain, sinus pressure, sinus pain and sore throat.   Respiratory: Positive for shortness of breath. Negative for cough.   Cardiovascular: Positive for chest pain. Negative for palpitations and leg swelling.  Gastrointestinal: Negative for abdominal pain, nausea and vomiting.  Musculoskeletal: Negative for arthralgias and myalgias.  Skin: Negative for rash.  Neurological: Negative for  weakness and headaches.  Hematological: Negative for adenopathy.     Physical Exam Triage Vital Signs ED Triage Vitals  Enc Vitals Group     BP 09/25/20 1439 111/78     Pulse Rate 09/25/20 1439 86     Resp 09/25/20 1439 18     Temp 09/25/20 1439 98.3 F (36.8 C)     Temp Source 09/25/20 1439 Oral     SpO2 09/25/20 1439 99 %     Weight 09/25/20 1435 171 lb 8.3 oz (77.8 kg)     Height 09/25/20 1435 5\' 1"  (1.549 m)     Head Circumference --      Peak Flow --      Pain Score 09/25/20 1435 9     Pain Loc --      Pain Edu? --      Excl. in GC? --    No data found.  Updated Vital Signs BP 111/78 (BP Location: Left Arm)   Pulse 86   Temp 98.3 F (36.8 C) (Oral)   Resp 18   Ht 5\' 1"  (1.549 m)   Wt 171 lb 8.3 oz (77.8 kg)   SpO2 99%   BMI 32.41 kg/m      Physical Exam Vitals and nursing note reviewed.  Constitutional:      General: She is not in acute distress.    Appearance: Normal appearance. She is not ill-appearing or toxic-appearing.  HENT:     Head: Normocephalic and atraumatic.     Nose: Congestion and rhinorrhea present.     Mouth/Throat:     Mouth: Mucous membranes are moist.     Pharynx: Oropharynx is clear.  Eyes:     General: No scleral icterus.       Right eye: No discharge.        Left eye: No discharge.     Conjunctiva/sclera: Conjunctivae normal.  Cardiovascular:     Rate and Rhythm: Normal rate and regular rhythm.     Heart sounds: Normal heart sounds.  Pulmonary:     Effort: Pulmonary effort is normal. No respiratory distress.     Breath sounds: Normal breath sounds. No wheezing or rales.  Chest:     Chest wall: Tenderness (diffuse moderate chest TTP. She is guarding with palpation of ches) present.  Musculoskeletal:     Cervical back: Neck supple.     Right lower leg: No edema.     Left lower leg: No edema.  Skin:    General: Skin is dry.  Neurological:     General: No focal deficit present.     Mental Status: She is alert. Mental  status is at baseline.     Motor: No weakness.  Gait: Gait normal.  Psychiatric:        Mood and Affect: Mood normal.        Behavior: Behavior normal.        Thought Content: Thought content normal.      UC Treatments / Results  Labs (all labs ordered are listed, but only abnormal results are displayed) Labs Reviewed  RESP PANEL BY RT-PCR (FLU A&B, COVID) ARPGX2 - Abnormal; Notable for the following components:      Result Value   SARS Coronavirus 2 by RT PCR POSITIVE (*)    All other components within normal limits    EKG   Radiology DG Chest 2 View  Result Date: 09/25/2020 CLINICAL DATA:  Chest pain. EXAM: CHEST - 2 VIEW COMPARISON:  09/03/2016. FINDINGS: Mediastinum and hilar structures normal. Low lung volumes. No acute infiltrate. No pleural effusion or pneumothorax. Heart size normal. Thoracic spine scoliosis. No acute bony abnormality. Small metallic density noted over the anterior chest. IMPRESSION: Low lung volumes. No acute cardiopulmonary disease. Electronically Signed   By: Maisie Fus  Register   On: 09/25/2020 15:11    Procedures ED EKG  Date/Time: 09/25/2020 3:01 PM Performed by: Shirlee Latch, PA-C Authorized by: Shirlee Latch, PA-C   ECG reviewed by ED Physician in the absence of a cardiologist: yes   Previous ECG:    Previous ECG:  Unavailable Interpretation:    Interpretation: normal   Rate:    ECG rate:  79   ECG rate assessment: normal   Rhythm:    Rhythm: sinus rhythm   Ectopy:    Ectopy: none   QRS:    QRS axis:  Normal Conduction:    Conduction: normal   ST segments:    ST segments:  Normal T waves:    T waves: normal   Comments:     Normal sinus rhythm, regular rate   (including critical care time)  Medications Ordered in UC Medications  ketorolac (TORADOL) injection 60 mg (has no administration in time range)    Initial Impression / Assessment and Plan / UC Course  I have reviewed the triage vital signs and the nursing  notes.  Pertinent labs & imaging results that were available during my care of the patient were reviewed by me and considered in my medical decision making (see chart for details).    29 year old female presenting for 2-day history of diffuse chest pain and occasional shortness of breath.  Admits to 1 day history of nasal congestion.  She does not report any other symptoms.  No significant past medical history of any cardiopulmonary disease or DVT/PE.  No blood clotting disorders.  All vital signs are stable and within normal limits.  She is afebrile.  She is in no acute distress.  EKG obtained immediately upon arrival given complaint.  EKG is within normal limits.  Heart rate is 79 bpm and normal sinus rhythm.  Chest x-ray also obtained which did not show any indication of acute cardiopulmonary disease.  On exam, she does have moderate diffuse chest tenderness on palpation with guarding.  She also has some mild nasal congestion and rhinorrhea.  Chest is clear to auscultation heart regular rate and rhythm.  Respiratory panel obtained with positive result for COVID-19.  Discussed results with patient.  Advised her to isolate for 8 more days.  60 mg IM ketorolac given in clinic for her musculoskeletal chest pain.  Provide supportive care with Tylenol and ibuprofen for the discomfort.  Also sent decongestant.  ED precautions thoroughly reviewed with patient including advised her to call EMS or have someone take her to the emergency department if she develops uncontrollable fevers, increased or worsening chest pain, increased difficulty breathing or weakness.  Patient is understanding and agreeable.   Final Clinical Impressions(s) / UC Diagnoses   Final diagnoses:  COVID-19  Chest wall pain  Nasal congestion     Discharge Instructions     Your Covid test is positive.  Your EKG and chest x-ray were both normal.  There may be some musculoskeletal component to your chest pain as well since it  does hurt whenever it is pressed on.  All of your vital signs are normal.  You need to be isolated for 8 more full days.  I have sent ibuprofen to the pharmacy to help with the chest discomfort.  You can also take Tylenol.  I sent a cough syrup with a decongestant as well if needed.  Go to emergency department or call EMS if your chest pain worsens, you develop increased breathing difficulty, you have fevers that you cannot control, you have any new or worsening symptoms.    ED Prescriptions    Medication Sig Dispense Auth. Provider   ibuprofen (ADVIL) 600 MG tablet Take 1 tablet (600 mg total) by mouth every 6 (six) hours as needed for up to 10 days. 30 tablet Eusebio Friendly B, PA-C   brompheniramine-pseudoephedrine-DM 30-2-10 MG/5ML syrup Take 10 mLs by mouth 4 (four) times daily as needed for up to 7 days. 150 mL Shirlee Latch, PA-C     PDMP not reviewed this encounter.   Shirlee Latch, PA-C 09/25/20 [phone]

## 2020-09-25 NOTE — Discharge Instructions (Signed)
Your Covid test is positive.  Your EKG and chest x-ray were both normal.  There may be some musculoskeletal component to your chest pain as well since it does hurt whenever it is pressed on.  All of your vital signs are normal.  You need to be isolated for 8 more full days.  I have sent ibuprofen to the pharmacy to help with the chest discomfort.  You can also take Tylenol.  I sent a cough syrup with a decongestant as well if needed.  Go to emergency department or call EMS if your chest pain worsens, you develop increased breathing difficulty, you have fevers that you cannot control, you have any new or worsening symptoms.

## 2020-09-26 ENCOUNTER — Telehealth: Payer: Self-pay | Admitting: Nurse Practitioner

## 2020-09-26 ENCOUNTER — Ambulatory Visit: Payer: Self-pay

## 2020-09-26 DIAGNOSIS — U071 COVID-19: Secondary | ICD-10-CM

## 2020-09-26 NOTE — Telephone Encounter (Signed)
Called to Discuss with patient about Covid symptoms and the use of a monoclonal antibody infusion for those with mild to moderate Covid symptoms and at a high risk of hospitalization.     Pt is qualified for this infusion at the [facility] center due to co-morbid conditions and/or a member of an at-risk group.     Unable to reach pt. Left message to return call.   [facility]4  Consuello Masse, DNP, AGNP-C [phone] (Infusion Center Hotline)

## 2020-09-26 NOTE — Telephone Encounter (Signed)
Patient returned my call. She would like to speak to her mother and will call back if she wishes to proceed with mAB treatment.

## 2020-10-15 ENCOUNTER — Telehealth: Payer: Self-pay | Admitting: Family Medicine

## 2020-10-15 NOTE — Telephone Encounter (Signed)
Pt called and wants to speak with a nurse about her menstrual cycle not returning after she stopped her Depo.

## 2020-10-15 NOTE — Telephone Encounter (Signed)
Patient reports she has not had a period since stopping depo 7 months ago. Patient is also trying to become pregnant at this time. Patient counseled that it can take up to 12 months to have a regular period. Patient would like preconception counseling and would like to talk about the option of taking birth control pills to get a period started.   Harvie Heck, RN

## 2020-10-23 ENCOUNTER — Ambulatory Visit: Payer: Self-pay

## 2021-04-13 ENCOUNTER — Telehealth: Payer: Self-pay | Admitting: Family Medicine

## 2021-04-13 NOTE — Telephone Encounter (Signed)
Patient called because she wants to speak to a nurse about her irregular period. She was on depo and is wondering if that may be the cause. Offered an appointment, patient declined and would like a call back.

## 2021-04-28 ENCOUNTER — Encounter: Payer: Medicaid Other | Admitting: Obstetrics and Gynecology

## 2021-05-12 ENCOUNTER — Encounter: Payer: Medicaid Other | Admitting: Obstetrics and Gynecology

## 2021-05-26 ENCOUNTER — Encounter: Payer: Medicaid Other | Admitting: Obstetrics and Gynecology

## 2022-03-17 ENCOUNTER — Telehealth: Payer: Self-pay | Admitting: Licensed Clinical Social Worker

## 2022-03-17 NOTE — Telephone Encounter (Signed)
LCSW spoke with patient about virtual services. Patient to call back once she has signed consent to schedule the appointment.

## 2022-06-08 ENCOUNTER — Ambulatory Visit
Admission: EM | Admit: 2022-06-08 | Discharge: 2022-06-08 | Disposition: A | Discharge status: Home / Self Care | Payer: Medicaid Other | Attending: Physician Assistant | Admitting: Physician Assistant

## 2022-06-08 DIAGNOSIS — N760 Acute vaginitis: Secondary | ICD-10-CM | POA: Diagnosis present

## 2022-06-08 DIAGNOSIS — N3001 Acute cystitis with hematuria: Secondary | ICD-10-CM | POA: Insufficient documentation

## 2022-06-08 LAB — URINALYSIS, ROUTINE W REFLEX MICROSCOPIC
Bilirubin Urine: NEGATIVE
Glucose, UA: NEGATIVE mg/dL
Ketones, ur: NEGATIVE mg/dL
Nitrite: NEGATIVE
Specific Gravity, Urine: 1.025 (ref 1.005–1.030)
pH: 6.5 (ref 5.0–8.0)

## 2022-06-08 LAB — URINALYSIS, MICROSCOPIC (REFLEX): WBC, UA: >50 WBC/hpf (ref 0–5)

## 2022-06-08 LAB — WET PREP, GENITAL
Sperm: NONE SEEN
Trich, Wet Prep: NONE SEEN
WBC, Wet Prep HPF POC: <10 — AB (ref <10)

## 2022-06-08 MED ORDER — METRONIDAZOLE 500 MG PO TABS: 500.0000 mg | ORAL_TABLET | Freq: Two times a day (BID) | ORAL | 0 refills | Status: AC

## 2022-06-08 MED ORDER — FLUCONAZOLE 150 MG PO TABS: ORAL_TABLET | ORAL | 0 refills | Status: DC

## 2022-06-08 MED ORDER — NITROFURANTOIN MONOHYD MACRO 100 MG PO CAPS: 100.0000 mg | ORAL_CAPSULE | Freq: Two times a day (BID) | ORAL | 0 refills | Status: DC

## 2022-06-08 NOTE — ED Triage Notes (Signed)
Patient presents to [facility] for UTI symptoms.   Patient c/o frequent urination.   Patient denies any discharge.

## 2022-06-08 NOTE — Discharge Instructions (Signed)
UTI: Based on either symptoms or urinalysis, you may have a urinary tract infection. We will send the urine for culture and call with results in a few days. Begin antibiotics at this time. Your symptoms should be much improved over the next 2-3 days. Increase rest and fluid intake. If for some reason symptoms are worsening or not improving after a couple of days or the urine culture determines the antibiotics you are taking will not treat the infection, the antibiotics may be changed. Return or go to ER for fever, back pain, worsening urinary pain, discharge, increased blood in urine. May take Tylenol or Motrin OTC for pain relief or consider AZO if no contraindications   You also had BV and yeast infection so I sent medication for that too.

## 2022-06-08 NOTE — ED Provider Notes (Signed)
[facility]    CSN: [id number] Arrival date & time: 06/08/22  1226      History   Chief Complaint Chief Complaint  Patient presents with   Urinary Frequency    HPI Shelley Arias is a 31 y.o. female presenting for urinary frequency and suprapubic pain for the past couple days.  Denies flank pain.  Has had a little bit of hematuria.  No fever.  Denies vaginal discharge, itching or odor.  No significant concern for STIs.  Last menstrual period 05/28/2022.  Patient believes she may have a UTI.  HPI  History reviewed. No pertinent past medical history.  Patient Active Problem List   Diagnosis Date Noted   Generalized anxiety disorder with panic attacks 05/25/2018   Major depressive disorder, recurrent episode, moderate (HCC) 05/25/2018   Overweight 09/06/2014    Past Surgical History:  Procedure Laterality Date   CESAREAN SECTION      OB History     Gravida  1   Para      Term      Preterm      AB      Living  1      SAB      IAB      Ectopic      Multiple      Live Births               Home Medications    Prior to Admission medications   Medication Sig Start Date End Date Taking? Authorizing Provider  fluconazole (DIFLUCAN) 150 MG tablet Take 1 tab p.o. every 72 hours for yeast infection 06/08/22  Yes Eusebio Friendly B, PA-C  metroNIDAZOLE (FLAGYL) 500 MG tablet Take 1 tablet (500 mg total) by mouth 2 (two) times daily for 7 days. 06/08/22 06/15/22 Yes Shirlee Latch, PA-C  nitrofurantoin, macrocrystal-monohydrate, (MACROBID) 100 MG capsule Take 1 capsule (100 mg total) by mouth 2 (two) times daily. 06/08/22  Yes Shirlee Latch, PA-C  Aspirin-Salicylamide-Caffeine (BC HEADACHE POWDER PO) Take 1 Package by mouth as needed.    [provider]  cyclobenzaprine (FLEXERIL) 5 MG tablet Take 1 tablet (5 mg total) by mouth 3 (three) times daily as needed for muscle spasms. Patient not taking: Reported on 05/23/2019 09/24/18    Minna Antis, MD  Multiple Vitamins-Minerals (HAIR/SKIN/NAILS) CAPS Take 1 tablet by mouth 2 (two) times daily.    [provider]    Family History Family History  Problem Relation Age of Onset   Diabetes Maternal Grandfather    Heart disease Maternal Grandfather    Hypertension Maternal Grandfather    Diabetes Paternal Grandmother    Heart disease Paternal Grandmother    Hypertension Paternal Grandmother    Diabetes Paternal Grandfather    Heart disease Paternal Grandfather    Hypertension Paternal Grandfather     Social History Social History   Tobacco Use   Smoking status: Former    Packs/day: 0.25    Types: Cigarettes   Smokeless tobacco: Never   Tobacco comments:    Quit last week in Dec 2020  Vaping Use   Vaping Use: Never used  Substance Use Topics   Alcohol use: No   Drug use: No     Allergies   Penicillins   Review of Systems Review of Systems  Constitutional:  Negative for chills, fatigue and fever.  Gastrointestinal:  Positive for abdominal pain. Negative for diarrhea, nausea and vomiting.  Genitourinary:  Positive for frequency and urgency.  Negative for decreased urine volume, dysuria, flank pain, hematuria, pelvic pain, vaginal bleeding, vaginal discharge and vaginal pain.  Musculoskeletal:  Negative for back pain.  Skin:  Negative for rash.     Physical Exam Triage Vital Signs ED Triage Vitals  Enc Vitals Group     BP      Pulse      Resp      Temp      Temp src      SpO2      Weight      Height      Head Circumference      Peak Flow      Pain Score      Pain Loc      Pain Edu?      Excl. in GC?    No data found.  Updated Vital Signs BP 114/73 (BP Location: Left Arm)   Pulse 69   Temp 98.5 F (36.9 C) (Oral)   Ht 5\' 1"  (1.549 m)   Wt 180 lb (81.6 kg)   LMP 05/28/2022 (Exact Date)   SpO2 98%   BMI 34.01 kg/m      Physical Exam Vitals and nursing note reviewed.  Constitutional:      General: She is not  in acute distress.    Appearance: Normal appearance. She is not ill-appearing or toxic-appearing.  HENT:     Head: Normocephalic and atraumatic.  Eyes:     General: No scleral icterus.       Right eye: No discharge.        Left eye: No discharge.     Conjunctiva/sclera: Conjunctivae normal.  Cardiovascular:     Rate and Rhythm: Normal rate and regular rhythm.     Heart sounds: Normal heart sounds.  Pulmonary:     Effort: Pulmonary effort is normal. No respiratory distress.     Breath sounds: Normal breath sounds.  Abdominal:     Palpations: Abdomen is soft.     Tenderness: There is abdominal tenderness (suprapubic, mild). There is no right CVA tenderness or left CVA tenderness.  Musculoskeletal:     Cervical back: Neck supple.  Skin:    General: Skin is dry.  Neurological:     General: No focal deficit present.     Mental Status: She is alert. Mental status is at baseline.     Motor: No weakness.     Gait: Gait normal.  Psychiatric:        Mood and Affect: Mood normal.        Behavior: Behavior normal.        Thought Content: Thought content normal.      UC Treatments / Results  Labs (all labs ordered are listed, but only abnormal results are displayed) Labs Reviewed  WET PREP, GENITAL - Abnormal; Notable for the following components:      Result Value   Yeast Wet Prep HPF POC PRESENT (*)    Clue Cells Wet Prep HPF POC PRESENT (*)    WBC, Wet Prep HPF POC <10 (*)    All other components within normal limits  URINALYSIS, ROUTINE W REFLEX MICROSCOPIC - Abnormal; Notable for the following components:   APPearance CLOUDY (*)    Hgb urine dipstick SMALL (*)    Protein, ur TRACE (*)    Leukocytes,Ua SMALL (*)    All other components within normal limits  URINALYSIS, MICROSCOPIC (REFLEX) - Abnormal; Notable for the following components:   Bacteria, UA MANY (*)  All other components within normal limits  URINE CULTURE    EKG   Radiology No results  found.  Procedures Procedures (including critical care time)  Medications Ordered in UC Medications - No data to display  Initial Impression / Assessment and Plan / UC Course  I have reviewed the triage vital signs and the nursing notes.  Pertinent labs & imaging results that were available during my care of the patient were reviewed by me and considered in my medical decision making (see chart for details).   31 year old female presenting for urinary frequency and suprapubic pain for the past couple of days.  Patient elects perform vaginal self swab.  Vaginal swab is positive for yeast and BV.  Urinalysis collected today shows small hemoglobin, trace protein and small leukocytes.  We will send urine for culture and treat for UTI and bacterial infections.  Advised we will change antibiotic based on culture if needed.  Encouraged her to increase rest and fluids.  Follow-up as needed.   Final Clinical Impressions(s) / UC Diagnoses   Final diagnoses:  Acute vaginitis  Acute cystitis with hematuria     Discharge Instructions      UTI: Based on either symptoms or urinalysis, you may have a urinary tract infection. We will send the urine for culture and call with results in a few days. Begin antibiotics at this time. Your symptoms should be much improved over the next 2-3 days. Increase rest and fluid intake. If for some reason symptoms are worsening or not improving after a couple of days or the urine culture determines the antibiotics you are taking will not treat the infection, the antibiotics may be changed. Return or go to ER for fever, back pain, worsening urinary pain, discharge, increased blood in urine. May take Tylenol or Motrin OTC for pain relief or consider AZO if no contraindications   You also had BV and yeast infection so I sent medication for that too.     ED Prescriptions     Medication Sig Dispense Auth. Provider   nitrofurantoin, macrocrystal-monohydrate, (MACROBID)  100 MG capsule Take 1 capsule (100 mg total) by mouth 2 (two) times daily. 10 capsule Eusebio Friendly B, PA-C   metroNIDAZOLE (FLAGYL) 500 MG tablet Take 1 tablet (500 mg total) by mouth 2 (two) times daily for 7 days. 14 tablet Eusebio Friendly B, PA-C   fluconazole (DIFLUCAN) 150 MG tablet Take 1 tab p.o. every 72 hours for yeast infection 2 tablet Shirlee Latch, PA-C      PDMP not reviewed this encounter.   Shirlee Latch, PA-C 06/08/22 1353

## 2022-06-10 LAB — URINE CULTURE: Culture: >=100000 — AB

## 2022-07-22 ENCOUNTER — Other Ambulatory Visit: Payer: Self-pay

## 2022-07-22 ENCOUNTER — Ambulatory Visit
Admission: EM | Admit: 2022-07-22 | Discharge: 2022-07-22 | Disposition: A | Discharge status: Home / Self Care | Payer: Medicaid Other | Attending: Family Medicine | Admitting: Family Medicine

## 2022-07-22 DIAGNOSIS — J039 Acute tonsillitis, unspecified: Secondary | ICD-10-CM

## 2022-07-22 LAB — GROUP A STREP BY PCR: Group A Strep by PCR: NOT DETECTED

## 2022-07-22 MED ORDER — CLARITHROMYCIN 250 MG PO TABS: 250.0000 mg | ORAL_TABLET | Freq: Two times a day (BID) | ORAL | 0 refills | Status: AC

## 2022-07-22 NOTE — ED Provider Notes (Signed)
[facility]    CSN: [id number] Arrival date & time: 07/22/22  2094      History   Chief Complaint Chief Complaint  Patient presents with   Sore Throat    HPI Shelley Arias is a 31 y.o. female.   HPI  31 year old female here for evaluation of sore throat.  Patient reports that she has been experiencing sore throat on the right-hand side for the last 3 days.  She states prior to that, beginning last week, she had runny nose, nasal congestion, and a productive cough.  The cough has resolved.  She denies any fever, ear pain, shortness of breath, or wheezing.  No known sick contacts.  History reviewed. No pertinent past medical history.  Patient Active Problem List   Diagnosis Date Noted   Generalized anxiety disorder with panic attacks 05/25/2018   Major depressive disorder, recurrent episode, moderate ([place]) 05/25/2018   Overweight 09/06/2014    Past Surgical History:  Procedure Laterality Date   CESAREAN SECTION      OB History     Gravida  1   Para      Term      Preterm      AB      Living  1      SAB      IAB      Ectopic      Multiple      Live Births               Home Medications    Prior to Admission medications   Medication Sig Start Date End Date Taking? Authorizing Provider  clarithromycin (BIAXIN) 250 MG tablet Take 1 tablet (250 mg total) by mouth 2 (two) times daily for 10 days. 07/22/22 08/01/22 Yes Margarette Canada, NP  Aspirin-Salicylamide-Caffeine (BC HEADACHE POWDER PO) Take 1 Package by mouth as needed.    [provider]  cyclobenzaprine (FLEXERIL) 5 MG tablet Take 1 tablet (5 mg total) by mouth 3 (three) times daily as needed for muscle spasms. Patient not taking: Reported on 05/23/2019 09/24/18   Harvest Dark, MD  fluconazole (DIFLUCAN) 150 MG tablet Take 1 tab p.o. every 72 hours for yeast infection 06/08/22   Laurene Footman B, PA-C  Multiple Vitamins-Minerals (HAIR/SKIN/NAILS) CAPS Take 1 tablet  by mouth 2 (two) times daily.    [provider]  nitrofurantoin, macrocrystal-monohydrate, (MACROBID) 100 MG capsule Take 1 capsule (100 mg total) by mouth 2 (two) times daily. 06/08/22   Danton Clap, PA-C    Family History Family History  Problem Relation Age of Onset   Diabetes Maternal Grandfather    Heart disease Maternal Grandfather    Hypertension Maternal Grandfather    Diabetes Paternal Grandmother    Heart disease Paternal Grandmother    Hypertension Paternal Grandmother    Diabetes Paternal Grandfather    Heart disease Paternal Grandfather    Hypertension Paternal Grandfather     Social History Social History   Tobacco Use   Smoking status: Former    Packs/day: 0.25    Types: Cigarettes   Smokeless tobacco: Never   Tobacco comments:    Quit last week in Dec 2020  Vaping Use   Vaping Use: Never used  Substance Use Topics   Alcohol use: No   Drug use: No     Allergies   Penicillins   Review of Systems Review of Systems  Constitutional:  Negative for fever.  HENT:  Positive for congestion, rhinorrhea and  sore throat. Negative for ear pain.   Respiratory:  Negative for shortness of breath and wheezing.   Hematological:  Positive for adenopathy.     Physical Exam Triage Vital Signs ED Triage Vitals  Enc Vitals Group     BP      Pulse      Resp      Temp      Temp src      SpO2      Weight      Height      Head Circumference      Peak Flow      Pain Score      Pain Loc      Pain Edu?      Excl. in GC?    No data found.  Updated Vital Signs BP 91/75 (BP Location: Right Arm)   Pulse 71   Temp 98.7 F (37.1 C) (Oral)   Resp 17   LMP 07/19/2022   Visual Acuity Right Eye Distance:   Left Eye Distance:   Bilateral Distance:    Right Eye Near:   Left Eye Near:    Bilateral Near:     Physical Exam Vitals and nursing note reviewed.  Constitutional:      Appearance: Normal appearance. She is not ill-appearing.  HENT:      Head: Normocephalic and atraumatic.     Right Ear: Tympanic membrane, ear canal and external ear normal. There is no impacted cerumen.     Left Ear: Tympanic membrane, ear canal and external ear normal. There is no impacted cerumen.     Nose: Congestion and rhinorrhea present.     Mouth/Throat:     Mouth: Mucous membranes are moist.     Pharynx: Oropharyngeal exudate and posterior oropharyngeal erythema present.  Cardiovascular:     Rate and Rhythm: Normal rate and regular rhythm.     Pulses: Normal pulses.     Heart sounds: Normal heart sounds. No murmur heard.    No friction rub. No gallop.  Pulmonary:     Effort: Pulmonary effort is normal.     Breath sounds: Normal breath sounds. No wheezing, rhonchi or rales.  Musculoskeletal:     Cervical back: Normal range of motion and neck supple.  Lymphadenopathy:     Cervical: Cervical adenopathy present.  Skin:    General: Skin is warm and dry.     Capillary Refill: Capillary refill takes less than 2 seconds.     Findings: No rash.  Neurological:     General: No focal deficit present.     Mental Status: She is alert and oriented to person, place, and time.  Psychiatric:        Mood and Affect: Mood normal.        Behavior: Behavior normal.        Thought Content: Thought content normal.        Judgment: Judgment normal.      UC Treatments / Results  Labs (all labs ordered are listed, but only abnormal results are displayed) Labs Reviewed  GROUP A STREP BY PCR    EKG   Radiology No results found.  Procedures Procedures (including critical care time)  Medications Ordered in UC Medications - No data to display  Initial Impression / Assessment and Plan / UC Course  I have reviewed the triage vital signs and the nursing notes.  Pertinent labs & imaging results that were available during my care of the patient were reviewed  by me and considered in my medical decision making (see chart for details).   Patient is a  nontoxic-appearing 35 female presenting for evaluation of sore throat and upper respiratory symptoms.  Denies runny nose, nasal congestion, and cough, which has resolved, started last week and the sore throat began 3 days ago.  She states it is more prominent on the right-hand side than the left.  On exam patient does have bilateral erythematous and edematous tonsillar pillars with white exudate.  There are prominent anterior lymph nodes in this cervical chain, greater on the right than the left.  Both of your ears are normal in appearance and her lungs are clear to auscultation all fields.  Her exam reveals signs of an upper respiratory infection and is also concern for strep given the erythema, edema, and exudate on her tonsils.  I will order strep PCR.  Strep PCR is negative.  I am still concerned that the patient has a bacterial process given the amount of erythema, edema, exudate in the posterior oropharynx.  Patient has an allergy to penicillin which includes nausea, vomiting, and swelling.  I will discharge patient home on clarithromycin 250 mg twice daily for 10 days.   Final Clinical Impressions(s) / UC Diagnoses   Final diagnoses:  Exudative tonsillitis     Discharge Instructions      Take the clarithromycin twice daily for 10 days for treatment of your tonsillitis.  Gargle with warm salt water 2-3 times a day to soothe your throat, aid in pain relief, and aid in healing.  Take over-the-counter ibuprofen according to the package instructions as needed for pain.  You can also use Chloraseptic or Sucrets lozenges, 1 lozenge every 2 hours as needed for throat pain.  If you develop any new or worsening symptoms return for reevaluation.      ED Prescriptions     Medication Sig Dispense Auth. Provider   clarithromycin (BIAXIN) 250 MG tablet Take 1 tablet (250 mg total) by mouth 2 (two) times daily for 10 days. 20 tablet Becky Augusta, NP      PDMP not reviewed this  encounter.   Becky Augusta, NP 07/22/22 1041

## 2022-07-22 NOTE — ED Triage Notes (Signed)
Pt is here with a sore throat for 3 days now, pt has not taken any meds to relieve discomfort. 

## 2022-07-22 NOTE — Discharge Instructions (Addendum)
Take the clarithromycin twice daily for 10 days for treatment of your tonsillitis.  Gargle with warm salt water 2-3 times a day to soothe your throat, aid in pain relief, and aid in healing.  Take over-the-counter ibuprofen according to the package instructions as needed for pain.  You can also use Chloraseptic or Sucrets lozenges, 1 lozenge every 2 hours as needed for throat pain.  If you develop any new or worsening symptoms return for reevaluation.

## 2022-08-05 ENCOUNTER — Ambulatory Visit
Admission: EM | Admit: 2022-08-05 | Discharge: 2022-08-05 | Disposition: A | Discharge status: Home / Self Care | Payer: Medicaid Other | Attending: Emergency Medicine | Admitting: Emergency Medicine

## 2022-08-05 DIAGNOSIS — Z1152 Encounter for screening for COVID-19: Secondary | ICD-10-CM | POA: Insufficient documentation

## 2022-08-05 DIAGNOSIS — J069 Acute upper respiratory infection, unspecified: Secondary | ICD-10-CM | POA: Diagnosis present

## 2022-08-05 LAB — SARS CORONAVIRUS 2 BY RT PCR: SARS Coronavirus 2 by RT PCR: NEGATIVE

## 2022-08-05 MED ORDER — PROMETHAZINE-DM 6.25-15 MG/5ML PO SYRP: 5.0000 mL | ORAL_SOLUTION | Freq: Four times a day (QID) | ORAL | 0 refills | Status: DC | PRN

## 2022-08-05 MED ORDER — BENZONATATE 100 MG PO CAPS: 200.0000 mg | ORAL_CAPSULE | Freq: Three times a day (TID) | ORAL | 0 refills | Status: DC

## 2022-08-05 MED ORDER — IPRATROPIUM BROMIDE 0.06 % NA SOLN: 2.0000 | Freq: Four times a day (QID) | NASAL | 12 refills | Status: DC

## 2022-08-05 NOTE — Discharge Instructions (Signed)
Your test for COVID was negative today. You have a viral upper respiratory infection.  Use OTC Tylenol and Ibuprofen as needed for pain.  Use the Atrovent nasal spray, 2 squirts in each nostril every 6 hours, as needed for runny nose and postnasal drip.  Use the Tessalon Perles every 8 hours during the day.  Take them with a small sip of water.  They may give you some numbness to the base of your tongue or a metallic taste in your mouth, this is normal.  Use the Promethazine DM cough syrup at bedtime for cough and congestion.  It will make you drowsy so do not take it during the day.  Return for reevaluation or see your primary care provider for any new or worsening symptoms.

## 2022-08-05 NOTE — ED Triage Notes (Signed)
Pt c/o congestion onset x3- days, pt denies any other symptoms.

## 2022-08-05 NOTE — ED Provider Notes (Signed)
[facility]    CSN: [id number] Arrival date & time: 08/05/22  1514      History   Chief Complaint Chief Complaint  Patient presents with   Nasal Congestion    HPI Shelley Arias is a 31 y.o. female.   HPI  31 year old female here for evaluation of nasal congestion.  Patient reports that she has been experiencing nasal congestion with clear nasal discharge, nonproductive cough, and mild shortness of breath when she goes up and down steps for the last 3 days.  She denies any fever, ear pain, sore throat, wheezing, or GI complaints.  History reviewed. No pertinent past medical history.  Patient Active Problem List   Diagnosis Date Noted   Generalized anxiety disorder with panic attacks 05/25/2018   Major depressive disorder, recurrent episode, moderate (HCC) 05/25/2018   Overweight 09/06/2014    Past Surgical History:  Procedure Laterality Date   CESAREAN SECTION      OB History     Gravida  1   Para      Term      Preterm      AB      Living  1      SAB      IAB      Ectopic      Multiple      Live Births               Home Medications    Prior to Admission medications   Medication Sig Start Date End Date Taking? Authorizing Provider  benzonatate (TESSALON) 100 MG capsule Take 2 capsules (200 mg total) by mouth every 8 (eight) hours. 08/05/22  Yes Becky Augusta, NP  ipratropium (ATROVENT) 0.06 % nasal spray Place 2 sprays into both nostrils 4 (four) times daily. 08/05/22  Yes Becky Augusta, NP  promethazine-dextromethorphan (PROMETHAZINE-DM) 6.25-15 MG/5ML syrup Take 5 mLs by mouth 4 (four) times daily as needed. 08/05/22  Yes Becky Augusta, NP  Aspirin-Salicylamide-Caffeine (BC HEADACHE POWDER PO) Take 1 Package by mouth as needed.    [provider]  cyclobenzaprine (FLEXERIL) 5 MG tablet Take 1 tablet (5 mg total) by mouth 3 (three) times daily as needed for muscle spasms. Patient not taking: Reported on 05/23/2019  09/24/18   Minna Antis, MD  fluconazole (DIFLUCAN) 150 MG tablet Take 1 tab p.o. every 72 hours for yeast infection 06/08/22   Eusebio Friendly B, PA-C  Multiple Vitamins-Minerals (HAIR/SKIN/NAILS) CAPS Take 1 tablet by mouth 2 (two) times daily.    [provider]  nitrofurantoin, macrocrystal-monohydrate, (MACROBID) 100 MG capsule Take 1 capsule (100 mg total) by mouth 2 (two) times daily. 06/08/22   Shirlee Latch, PA-C    Family History Family History  Problem Relation Age of Onset   Diabetes Maternal Grandfather    Heart disease Maternal Grandfather    Hypertension Maternal Grandfather    Diabetes Paternal Grandmother    Heart disease Paternal Grandmother    Hypertension Paternal Grandmother    Diabetes Paternal Grandfather    Heart disease Paternal Grandfather    Hypertension Paternal Grandfather     Social History Social History   Tobacco Use   Smoking status: Every Day    Packs/day: 0.25    Types: Cigarettes   Smokeless tobacco: Never   Tobacco comments:    Quit last week in Dec 2020  Vaping Use   Vaping Use: Never used  Substance Use Topics   Alcohol use: No   Drug use: No  Allergies   Penicillins   Review of Systems Review of Systems  Constitutional:  Negative for fever.  HENT:  Positive for congestion and rhinorrhea. Negative for ear pain and sore throat.   Respiratory:  Positive for cough and shortness of breath. Negative for wheezing.   Gastrointestinal:  Negative for diarrhea, nausea and vomiting.  Skin:  Negative for rash.  Hematological: Negative.   Psychiatric/Behavioral: Negative.       Physical Exam Triage Vital Signs ED Triage Vitals  Enc Vitals Group     BP 08/05/22 1537 114/86     Pulse Rate 08/05/22 1537 90     Resp --      Temp 08/05/22 1537 98.2 F (36.8 C)     Temp Source 08/05/22 1537 Oral     SpO2 08/05/22 1537 98 %     Weight 08/05/22 1535 168 lb (76.2 kg)     Height 08/05/22 1535 5\' 1"  (1.549 m)     Head  Circumference --      Peak Flow --      Pain Score 08/05/22 1535 0     Pain Loc --      Pain Edu? --      Excl. in [place]? --    No data found.  Updated Vital Signs BP 114/86 (BP Location: Left Arm)   Pulse 90   Temp 98.2 F (36.8 C) (Oral)   Ht 5\' 1"  (1.549 m)   Wt 168 lb (76.2 kg)   LMP 07/19/2022 (Exact Date)   SpO2 98%   BMI 31.74 kg/m   Visual Acuity Right Eye Distance:   Left Eye Distance:   Bilateral Distance:    Right Eye Near:   Left Eye Near:    Bilateral Near:     Physical Exam Vitals and nursing note reviewed.  Constitutional:      Appearance: Normal appearance. She is not ill-appearing.  HENT:     Head: Normocephalic and atraumatic.     Right Ear: Tympanic membrane, ear canal and external ear normal. There is no impacted cerumen.     Left Ear: Tympanic membrane, ear canal and external ear normal. There is no impacted cerumen.     Nose: Congestion and rhinorrhea present.     Comments: Nasal mucosa is erythematous and edematous with scant clear rhinorrhea in both nares.    Mouth/Throat:     Mouth: Mucous membranes are moist.     Pharynx: Oropharynx is clear. No oropharyngeal exudate or posterior oropharyngeal erythema.  Cardiovascular:     Rate and Rhythm: Normal rate and regular rhythm.     Pulses: Normal pulses.     Heart sounds: Normal heart sounds. No murmur heard.    No friction rub. No gallop.  Pulmonary:     Effort: Pulmonary effort is normal.     Breath sounds: Normal breath sounds. No wheezing, rhonchi or rales.  Musculoskeletal:     Cervical back: Normal range of motion and neck supple.  Lymphadenopathy:     Cervical: No cervical adenopathy.  Skin:    General: Skin is warm and dry.     Capillary Refill: Capillary refill takes less than 2 seconds.     Findings: No erythema or rash.  Neurological:     General: No focal deficit present.     Mental Status: She is alert and oriented to person, place, and time.  Psychiatric:        Mood and  Affect: Mood normal.  Behavior: Behavior normal.        Thought Content: Thought content normal.        Judgment: Judgment normal.      UC Treatments / Results  Labs (all labs ordered are listed, but only abnormal results are displayed) Labs Reviewed  SARS CORONAVIRUS 2 BY RT PCR    EKG   Radiology No results found.  Procedures Procedures (including critical care time)  Medications Ordered in UC Medications - No data to display  Initial Impression / Assessment and Plan / UC Course  I have reviewed the triage vital signs and the nursing notes.  Pertinent labs & imaging results that were available during my care of the patient were reviewed by me and considered in my medical decision making (see chart for details).   Patient is a nontoxic-appearing 31 year old female here for evaluation of respiratory symptoms as outlined HPI above.  On exam she does have erythema and edema of her nasal mucosa with scant clear rhinorrhea.  The remainder of her upper respiratory tree is benign.  Her lung sounds are clear to auscultation all fields.  Given that she has signs of an upper respiratory infection on her exam I will order a COVID PCR.  She has been afebrile and she has had symptoms for 3 days so she is outside the window for antiviral therapy for influenza so I will not order a flu test.  COVID PCR is negative.  I will discharge patient with diagnosis of viral URI with cough.  Tylenol and ibuprofen as needed for pain.  Atrovent nasal spray to help with nasal congestion.  I am also can prescribe Tessalon Perles and Promethazine DM cough syrup to help with cough and congestion.  Work note provided.   Final Clinical Impressions(s) / UC Diagnoses   Final diagnoses:  Viral URI with cough     Discharge Instructions      Your test for COVID was negative today. You have a viral upper respiratory infection.  Use OTC Tylenol and Ibuprofen as needed for pain.  Use the Atrovent  nasal spray, 2 squirts in each nostril every 6 hours, as needed for runny nose and postnasal drip.  Use the Tessalon Perles every 8 hours during the day.  Take them with a small sip of water.  They may give you some numbness to the base of your tongue or a metallic taste in your mouth, this is normal.  Use the Promethazine DM cough syrup at bedtime for cough and congestion.  It will make you drowsy so do not take it during the day.  Return for reevaluation or see your primary care provider for any new or worsening symptoms.      ED Prescriptions     Medication Sig Dispense Auth. Provider   benzonatate (TESSALON) 100 MG capsule Take 2 capsules (200 mg total) by mouth every 8 (eight) hours. 21 capsule Becky Augusta, NP   ipratropium (ATROVENT) 0.06 % nasal spray Place 2 sprays into both nostrils 4 (four) times daily. 15 mL Becky Augusta, NP   promethazine-dextromethorphan (PROMETHAZINE-DM) 6.25-15 MG/5ML syrup Take 5 mLs by mouth 4 (four) times daily as needed. 118 mL Becky Augusta, NP      PDMP not reviewed this encounter.   Becky Augusta, NP 08/05/22 [phone]

## 2022-12-16 ENCOUNTER — Encounter: Payer: Self-pay | Admitting: Physician Assistant

## 2022-12-16 ENCOUNTER — Ambulatory Visit: Payer: Medicaid Other | Admitting: Physician Assistant

## 2022-12-16 DIAGNOSIS — Z113 Encounter for screening for infections with a predominantly sexual mode of transmission: Secondary | ICD-10-CM

## 2022-12-16 DIAGNOSIS — A599 Trichomoniasis, unspecified: Secondary | ICD-10-CM

## 2022-12-16 LAB — HM HIV SCREENING LAB: HM HIV Screening: NEGATIVE

## 2022-12-16 LAB — WET PREP FOR TRICH, YEAST, CLUE
Trichomonas Exam: POSITIVE — AB
Yeast Exam: NEGATIVE

## 2022-12-16 MED ORDER — METRONIDAZOLE 500 MG PO TABS: 500.0000 mg | ORAL_TABLET | Freq: Two times a day (BID) | ORAL | 0 refills | Status: DC

## 2022-12-16 NOTE — Progress Notes (Signed)
Wet prep reviewed and treated for trich per standing order. Rich Number, RN

## 2022-12-16 NOTE — Progress Notes (Signed)
[facility] Department  STI clinic/screening visit [place] [address] [phone]  Subjective:  Shelley Arias is a 32 y.o. female being seen today for an STI screening visit. The patient reports they do not have symptoms.  Patient reports that they do not desire a pregnancy in the next year.   They reported they are not interested in discussing contraception today.    Patient's last menstrual period was 12/01/2022 (exact date).  Patient has the following medical conditions:   Patient Active Problem List   Diagnosis Date Noted   Generalized anxiety disorder with panic attacks 05/25/2018   Major depressive disorder, recurrent episode, moderate ([place]) 05/25/2018   Overweight 09/06/2014    Chief Complaint  Patient presents with   SEXUALLY TRANSMITTED DISEASE    32 yo woman here for STI screening. Had sex with a female once in last 2 mo and condom may have broken. LMP 12/01/22. Uses only condoms for contraception   Patient reports no known STI exposure.  Does the patient using douching products? No  Last HIV test per patient/review of record was  Lab Results  Component Value Date   HMHIVSCREEN Negative - Validated 05/08/2018   No results found for: "HIV" Patient reports last pap was No results found for: "DIAGPAP" No results found for: "SPECADGYN"  Screening for MPX risk: Does the patient have an unexplained rash? No Is the patient MSM? No Does the patient endorse multiple sex partners or anonymous sex partners? No Did the patient have close or sexual contact with a person diagnosed with MPX? No Has the patient traveled outside the [place] where MPX is endemic? No Is there a high clinical suspicion for MPX-- evidenced by one of the following No  -Unlikely to be chickenpox  -Lymphadenopathy  -Rash that present in same phase of evolution on any given body part See flowsheet for further details and programmatic requirements.   Immunization  history:  Immunization History  Administered Date(s) Administered   Influenza,inj,Quad PF,6+ Mos 08/08/2019     The following portions of the patient's history were reviewed and updated as appropriate: allergies, current medications, past medical history, past social history, past surgical history and problem list.  Objective:  There were no vitals filed for this visit.  Physical Exam Vitals and nursing note reviewed.  Constitutional:      Appearance: Normal appearance. She is obese.  HENT:     Head: Normocephalic and atraumatic.     Mouth/Throat:     Mouth: Mucous membranes are moist.     Pharynx: Oropharynx is clear. No oropharyngeal exudate or posterior oropharyngeal erythema.  Pulmonary:     Effort: Pulmonary effort is normal.  Abdominal:     General: Abdomen is flat.     Palpations: There is no mass.     Tenderness: There is no abdominal tenderness. There is no rebound.  Genitourinary:    General: Normal vulva.     Exam position: Lithotomy position.     Pubic Area: No rash or pubic lice.      Labia:        Right: No rash or lesion.        Left: No rash or lesion.      Vagina: Vaginal discharge present. No erythema, bleeding or lesions.     Cervix: No cervical motion tenderness, discharge, friability, lesion or erythema.     Uterus: Normal.      Adnexa: Right adnexa normal and left adnexa normal.  Rectum: Normal.     Comments: pH = 4.0; sm amt white adherent vag discharge without odor Lymphadenopathy:     Head:     Right side of head: No preauricular or posterior auricular adenopathy.     Left side of head: No preauricular or posterior auricular adenopathy.     Cervical: No cervical adenopathy.     Upper Body:     Right upper body: No supraclavicular, axillary or epitrochlear adenopathy.     Left upper body: No supraclavicular, axillary or epitrochlear adenopathy.     Lower Body: No right inguinal adenopathy. No left inguinal adenopathy.  Skin:    General: Skin  is warm and dry.     Findings: No rash.  Neurological:     Mental Status: She is alert and oriented to person, place, and time.    Assessment and Plan:  WRYN FENECH is a 32 y.o. female presenting to the [facility] Department for STI screening  1. Routine screening for STI (sexually transmitted infection) Treat wet prep per so. Await other test results. Enc routine well-woman exam for routine care. - WET PREP FOR [place], YEAST, CLUE - [facility] - Syphilis Serology, [facility] - [facility]   Patient accepted all screenings including vaginal CT/GC and bloodwork for HIV/RPR, and wet prep. Patient meets criteria for HepB screening? No. Ordered? no Patient meets criteria for HepC screening? No. Ordered? no  Treat wet prep per standing order Discussed time line for [facility] results and that patient will be called with positive results and encouraged patient to call if she had not heard in 2 weeks.  Counseled to return or seek care for continued or worsening symptoms Recommended repeat testing in 3 months with positive results. Recommended condom use with all sex  Patient is currently using  condoms  to prevent pregnancy.  Pt declines more statistically reliable birth control method.  Return in about 6 months (around 06/16/2023) for STI screening.  No future appointments.  [name], PA-C

## 2022-12-21 ENCOUNTER — Telehealth: Payer: Self-pay | Admitting: Family Medicine

## 2022-12-21 NOTE — Telephone Encounter (Signed)
Please call me back regarding my medication that I was given I have side effects

## 2023-01-18 NOTE — Telephone Encounter (Signed)
Please give me a call back I just had my period earlier this month and now I have it again I was wondering is that normal for my period to come on again in the same month

## 2023-02-11 ENCOUNTER — Ambulatory Visit
Admission: EM | Admit: 2023-02-11 | Discharge: 2023-02-11 | Disposition: A | Discharge status: Home / Self Care | Payer: Medicaid Other | Attending: Family Medicine | Admitting: Family Medicine

## 2023-02-11 ENCOUNTER — Encounter: Payer: Self-pay | Admitting: Emergency Medicine

## 2023-02-11 DIAGNOSIS — J302 Other seasonal allergic rhinitis: Secondary | ICD-10-CM | POA: Diagnosis not present

## 2023-02-11 MED ORDER — IPRATROPIUM BROMIDE 0.06 % NA SOLN: 2.0000 | Freq: Four times a day (QID) | NASAL | 12 refills | Status: DC

## 2023-02-11 MED ORDER — CETIRIZINE HCL 10 MG PO TABS: 10.0000 mg | ORAL_TABLET | Freq: Every day | ORAL | 2 refills | Status: AC

## 2023-02-11 NOTE — ED Provider Notes (Signed)
[facility]    CSN: [id number] Arrival date & time: 02/11/23  1740      History   Chief Complaint Chief Complaint  Patient presents with   Sinus Problem   Nasal Congestion    HPI Shelley Arias is a 31 y.o. female.   HPI   Shelley Arias presents for sinus pressure, nasal congestion with headache the past week.  She takes Claritin but has run out of her regular allergy medication. No fever, sore throat, vomiting, diarrhea or shortness of breath.    History reviewed. No pertinent past medical history.  Patient Active Problem List   Diagnosis Date Noted   Generalized anxiety disorder with panic attacks 05/25/2018   Major depressive disorder, recurrent episode, moderate (HCC) 05/25/2018   Overweight 09/06/2014    Past Surgical History:  Procedure Laterality Date   CESAREAN SECTION      OB History     Gravida  1   Para      Term      Preterm      AB      Living  1      SAB      IAB      Ectopic      Multiple      Live Births               Home Medications    Prior to Admission medications   Medication Sig Start Date End Date Taking? Authorizing Provider  cetirizine (ZYRTEC ALLERGY) 10 MG tablet Take 1 tablet (10 mg total) by mouth daily. 02/11/23  Yes Eulia Hatcher, DO  Aspirin-Salicylamide-Caffeine (BC HEADACHE POWDER PO) Take 1 Package by mouth as needed.    [provider]  ipratropium (ATROVENT) 0.06 % nasal spray Place 2 sprays into both nostrils 4 (four) times daily. 02/11/23   Katha Cabal, DO  Multiple Vitamins-Minerals (HAIR/SKIN/NAILS) CAPS Take 1 tablet by mouth 2 (two) times daily.    [provider]    Family History Family History  Problem Relation Age of Onset   Diabetes Maternal Grandfather    Heart disease Maternal Grandfather    Hypertension Maternal Grandfather    Diabetes Paternal Grandmother    Heart disease Paternal Grandmother    Hypertension Paternal Grandmother    Diabetes  Paternal Grandfather    Heart disease Paternal Grandfather    Hypertension Paternal Grandfather     Social History Social History   Tobacco Use   Smoking status: Former    Packs/day: .25    Types: Cigarettes   Smokeless tobacco: Never   Tobacco comments:    12/16/2022 Reports last smoked cigarettes in 08/2022. Currently using e-cigarettes with flavoring, no nicotine.  Vaping Use   Vaping Use: Some days  Substance Use Topics   Alcohol use: No   Drug use: No     Allergies   Penicillins   Review of Systems Review of Systems: negative unless otherwise stated in HPI.      Physical Exam Triage Vital Signs ED Triage Vitals  Enc Vitals Group     BP 02/11/23 1801 107/71     Pulse Rate 02/11/23 1801 83     Resp 02/11/23 1801 14     Temp 02/11/23 1801 99 F (37.2 C)     Temp Source 02/11/23 1801 Oral     SpO2 02/11/23 1801 98 %     Weight 02/11/23 1759 180 lb (81.6 kg)     Height 02/11/23 1759 5\' 1"  (1.549  m)     Head Circumference --      Peak Flow --      Pain Score 02/11/23 1758 8     Pain Loc --      Pain Edu? --      Excl. in [place]? --    No data found.  Updated Vital Signs BP 107/71 (BP Location: Right Arm)   Pulse 83   Temp 99 F (37.2 C) (Oral)   Resp 14   Ht 5\' 1"  (1.549 m)   Wt 81.6 kg   LMP 01/14/2023 (Approximate)   SpO2 98%   BMI 34.01 kg/m   Visual Acuity Right Eye Distance:   Left Eye Distance:   Bilateral Distance:    Right Eye Near:   Left Eye Near:    Bilateral Near:     Physical Exam GEN:     alert, well appearing female in no distress    HENT:  mucus membranes moist, oropharyngeal without erythema, lesions or exudate, no tonsillar hypertrophy,  moderate erythematous edematous turbinates, clear nasal discharge, bilateral TM normal EYES:   pupils equal and reactive, no scleral injection or discharge NECK:  normal ROM RESP:  no increased work of breathing, clear to auscultation bilaterally CVS:   regular rate and rhythm Skin:   warm  and dry    UC Treatments / Results  Labs (all labs ordered are listed, but only abnormal results are displayed) Labs Reviewed - No data to display  EKG   Radiology No results found.  Procedures Procedures (including critical care time)  Medications Ordered in UC Medications - No data to display  Initial Impression / Assessment and Plan / UC Course  I have reviewed the triage vital signs and the nursing notes.  Pertinent labs & imaging results that were available during my care of the patient were reviewed by me and considered in my medical decision making (see chart for details).       Pt is a 32 y.o. female who presents for respiratory symptoms. Shelley Arias is afebrile here without recent antipyretics. Satting well on room air. Overall pt is well appearing, well hydrated, without respiratory distress. Pulmonary exam is unremarkable. History consistent with viral respiratory illness vs allergies. Discussed symptomatic treatment.  Explained lack of efficacy of antibiotics in viral disease.  Typical duration of symptoms discussed. Start Zrytec and Atrovent nasal spray.   Return and ED precautions given and voiced understanding. Discussed MDM, treatment plan and plan for follow-up with patient who agrees with plan.     Final Clinical Impressions(s) / UC Diagnoses   Final diagnoses:  Seasonal allergies     Discharge Instructions      Stop by the pharmacy to pick up your prescriptions.  Follow up with your primary care provider as needed.      ED Prescriptions     Medication Sig Dispense Auth. Provider   ipratropium (ATROVENT) 0.06 % nasal spray Place 2 sprays into both nostrils 4 (four) times daily. 15 mL Aadit Hagood, DO   cetirizine (ZYRTEC ALLERGY) 10 MG tablet Take 1 tablet (10 mg total) by mouth daily. 30 tablet Katha Cabal, DO      PDMP not reviewed this encounter.   Katha Cabal, DO 02/23/23 0151

## 2023-02-11 NOTE — Discharge Instructions (Signed)
Stop by the pharmacy to pick up your prescriptions.  Follow up with your primary care provider as needed.  

## 2023-02-11 NOTE — ED Triage Notes (Signed)
Patient c/o sinus pressure and congestion, nasal congestion and headache for a week.  Patient denies fevers.  Patient has been taking Claritin with no relief.

## 2023-06-01 ENCOUNTER — Ambulatory Visit
Admission: EM | Admit: 2023-06-01 | Discharge: 2023-06-01 | Disposition: A | Discharge status: Home / Self Care | Payer: Medicaid Other | Attending: Physician Assistant | Admitting: Physician Assistant

## 2023-06-01 DIAGNOSIS — Z20822 Contact with and (suspected) exposure to covid-19: Secondary | ICD-10-CM | POA: Diagnosis not present

## 2023-06-01 DIAGNOSIS — R519 Headache, unspecified: Secondary | ICD-10-CM | POA: Diagnosis not present

## 2023-06-01 DIAGNOSIS — R0602 Shortness of breath: Secondary | ICD-10-CM | POA: Insufficient documentation

## 2023-06-01 LAB — SARS CORONAVIRUS 2 BY RT PCR: SARS Coronavirus 2 by RT PCR: NEGATIVE

## 2023-06-01 NOTE — Discharge Instructions (Signed)
-  Negative COVID test.  Symptoms could be due to another virus.  It is a good sign that your symptoms have improved and you are feeling better. - If you feel that your shortness of breath worsens or you develop any associated chest pain, racing heart, fatigue, weakness, increased dizziness, vomiting, abdominal pain, etc. please go to the ER.

## 2023-06-01 NOTE — ED Provider Notes (Signed)
[facility]    CSN: [id number] Arrival date & time: 06/01/23  0813      History   Chief Complaint Chief Complaint  Patient presents with   Shortness of Breath   Headache    HPI Shelley Arias is a 32 y.o. female presenting for body aches, fatigue, headaches, chills/sweats and dizziness that began yesterday.  Patient states she feels like she is short of breath at times.  She denies any wheezing. She says she is feeling a bit better today and is only feeling a little short of breath; other symptoms have resolved. She denies any palpitations, chest pain or leg swelling.  Denies any fever, sore throat, congestion, or cough.  Denies any abdominal pain, nausea/vomiting or diarrhea.  Patient has no history of any cardiopulmonary disease.  Denies any known Covid exposure.  Has not taken any medication for symptoms.  Medical history significant for anxiety, depression, and obesity.  No other medical conditions. She would like a COVID test. No other concerns today.  HPI  History reviewed. No pertinent past medical history.  Patient Active Problem List   Diagnosis Date Noted   Generalized anxiety disorder with panic attacks 05/25/2018   Major depressive disorder, recurrent episode, moderate (HCC) 05/25/2018   Overweight 09/06/2014    Past Surgical History:  Procedure Laterality Date   CESAREAN SECTION      OB History     Gravida  1   Para      Term      Preterm      AB      Living  1      SAB      IAB      Ectopic      Multiple      Live Births               Home Medications    Prior to Admission medications   Medication Sig Start Date End Date Taking? Authorizing Provider  Aspirin-Salicylamide-Caffeine (BC HEADACHE POWDER PO) Take 1 Package by mouth as needed.   Yes [provider]  cetirizine (ZYRTEC ALLERGY) 10 MG tablet Take 1 tablet (10 mg total) by mouth daily. 02/11/23   Brimage, Seward Meth, DO  ipratropium (ATROVENT) 0.06 %  nasal spray Place 2 sprays into both nostrils 4 (four) times daily. 02/11/23   Katha Cabal, DO  Multiple Vitamins-Minerals (HAIR/SKIN/NAILS) CAPS Take 1 tablet by mouth 2 (two) times daily.    [provider]    Family History Family History  Problem Relation Age of Onset   Diabetes Maternal Grandfather    Heart disease Maternal Grandfather    Hypertension Maternal Grandfather    Diabetes Paternal Grandmother    Heart disease Paternal Grandmother    Hypertension Paternal Grandmother    Diabetes Paternal Grandfather    Heart disease Paternal Grandfather    Hypertension Paternal Grandfather     Social History Social History   Tobacco Use   Smoking status: Former    Current packs/day: 0.25    Types: Cigarettes   Smokeless tobacco: Never   Tobacco comments:    12/16/2022 Reports last smoked cigarettes in 08/2022. Currently using e-cigarettes with flavoring, no nicotine.  Vaping Use   Vaping status: Some Days  Substance Use Topics   Alcohol use: No   Drug use: No     Allergies   Penicillins   Review of Systems Review of Systems  Constitutional:  Positive for chills, diaphoresis and fatigue. Negative for fever.  HENT:  Negative for congestion, ear pain, rhinorrhea, sinus pressure, sinus pain and sore throat.   Respiratory:  Positive for shortness of breath. Negative for cough.   Cardiovascular:  Negative for chest pain, palpitations and leg swelling.  Gastrointestinal:  Negative for abdominal pain, nausea and vomiting.  Musculoskeletal:  Positive for myalgias.  Skin:  Negative for rash.  Neurological:  Positive for dizziness and headaches. Negative for weakness.  Hematological:  Negative for adenopathy.     Physical Exam Triage Vital Signs ED Triage Vitals  Enc Vitals Group     BP 09/25/20 1439 111/78     Pulse Rate 09/25/20 1439 86     Resp 09/25/20 1439 18     Temp 09/25/20 1439 98.3 F (36.8 C)     Temp Source 09/25/20 1439 Oral     SpO2  09/25/20 1439 99 %     Weight 09/25/20 1435 171 lb 8.3 oz (77.8 kg)     Height 09/25/20 1435 5\' 1"  (1.549 m)     Head Circumference --      Peak Flow --      Pain Score 09/25/20 1435 9     Pain Loc --      Pain Edu? --      Excl. in GC? --    No data found.  Updated Vital Signs BP 106/71 (BP Location: Right Arm)   Pulse 93   Temp 98.6 F (37 C) (Oral)   Resp 19   Wt 178 lb (80.7 kg)   SpO2 96%   BMI 33.63 kg/m      Physical Exam Vitals and nursing note reviewed.  Constitutional:      General: She is not in acute distress.    Appearance: Normal appearance. She is not ill-appearing or toxic-appearing.  HENT:     Head: Normocephalic and atraumatic.     Nose: Nose normal. No congestion or rhinorrhea.     Mouth/Throat:     Mouth: Mucous membranes are moist.     Pharynx: Oropharynx is clear.  Eyes:     General: No scleral icterus.       Right eye: No discharge.        Left eye: No discharge.     Conjunctiva/sclera: Conjunctivae normal.  Cardiovascular:     Rate and Rhythm: Normal rate and regular rhythm.     Heart sounds: Normal heart sounds.  Pulmonary:     Effort: Pulmonary effort is normal. No respiratory distress.     Breath sounds: Normal breath sounds. No wheezing or rales.  Musculoskeletal:     Cervical back: Neck supple.  Skin:    General: Skin is dry.  Neurological:     General: No focal deficit present.     Mental Status: She is alert. Mental status is at baseline.     Motor: No weakness.     Gait: Gait normal.  Psychiatric:        Mood and Affect: Mood normal.        Behavior: Behavior normal.        Thought Content: Thought content normal.      UC Treatments / Results  Labs (all labs ordered are listed, but only abnormal results are displayed) Labs Reviewed  SARS CORONAVIRUS 2 BY RT PCR    EKG   Radiology No results found.   Procedures Procedures (including critical care time)  Medications Ordered in UC Medications - No data to  display   Initial Impression / Assessment  and Plan / UC Course  I have reviewed the triage vital signs and the nursing notes.  Pertinent labs & imaging results that were available during my care of the patient were reviewed by me and considered in my medical decision making (see chart for details).    32 year old female presenting for headaches, body aches, fatigue, dizziness, sweats, chills, and mild SOB yesterday. Only having mild SOB now.  All vital signs are stable and within normal limits.  She is afebrile.  She is in no acute distress.  On exam, she does not appear ill.  Normal HEENT exam. Chest is clear to auscultation and heart regular rate and rhythm.  Test obtained for COVID-19.  Negative. Discussed results with patient.   Possibly viral illness. Patient is feeling better. ED precautions thoroughly reviewed with patient including advised her to call EMS or have someone take her to the emergency department if she develops uncontrollable fevers, increased or worsening chest pain, increased difficulty breathing or weakness.  Patient is understanding and agreeable.   Final Clinical Impressions(s) / UC Diagnoses   Final diagnoses:  Shortness of breath  Acute nonintractable headache, unspecified headache type  Encounter for laboratory testing for COVID-19 virus     Discharge Instructions      -Negative COVID test.  Symptoms could be due to another virus.  It is a good sign that your symptoms have improved and you are feeling better. - If you feel that your shortness of breath worsens or you develop any associated chest pain, racing heart, fatigue, weakness, increased dizziness, vomiting, abdominal pain, etc. please go to the ER.     ED Prescriptions   None    PDMP not reviewed this encounter.       Shirlee Latch, PA-C 06/01/23 1014

## 2023-06-01 NOTE — ED Triage Notes (Signed)
chills/sweats dizzy ha sob 1 day ago,. Took bc powder for headache. Last taken this morning.

## 2023-06-06 ENCOUNTER — Telehealth: Payer: Self-pay | Admitting: Family Medicine

## 2023-06-06 NOTE — Telephone Encounter (Signed)
Pt is having symptoms. Appointment made for 8/15 @ 10:00. Berdie Ogren, RN

## 2023-06-06 NOTE — Telephone Encounter (Signed)
Patient made an apt for 8/26 but wants to talk to nurse about symptoms.

## 2023-06-09 ENCOUNTER — Ambulatory Visit: Payer: Medicaid Other

## 2023-06-20 ENCOUNTER — Ambulatory Visit: Payer: Medicaid Other

## 2023-08-12 ENCOUNTER — Ambulatory Visit
Admission: EM | Admit: 2023-08-12 | Discharge: 2023-08-12 | Disposition: A | Discharge status: Home / Self Care | Payer: Medicaid Other | Attending: Internal Medicine | Admitting: Internal Medicine

## 2023-08-12 DIAGNOSIS — R0982 Postnasal drip: Secondary | ICD-10-CM | POA: Diagnosis not present

## 2023-08-12 DIAGNOSIS — J0100 Acute maxillary sinusitis, unspecified: Secondary | ICD-10-CM | POA: Diagnosis not present

## 2023-08-12 DIAGNOSIS — H66002 Acute suppurative otitis media without spontaneous rupture of ear drum, left ear: Secondary | ICD-10-CM

## 2023-08-12 MED ORDER — FLUTICASONE PROPIONATE 50 MCG/ACT NA SUSP: 1.0000 | Freq: Every day | NASAL | 0 refills | Status: AC

## 2023-08-12 MED ORDER — PROMETHAZINE-DM 6.25-15 MG/5ML PO SYRP: 5.0000 mL | ORAL_SOLUTION | Freq: Four times a day (QID) | ORAL | 0 refills | Status: DC | PRN

## 2023-08-12 MED ORDER — DOXYCYCLINE HYCLATE 100 MG PO CAPS: 100.0000 mg | ORAL_CAPSULE | Freq: Two times a day (BID) | ORAL | 0 refills | Status: AC

## 2023-08-12 NOTE — ED Provider Notes (Signed)
[facility]    CSN: [id number] Arrival date & time: 08/12/23  1826      History   Chief Complaint Chief Complaint  Patient presents with   Headache   Ear Problem         HPI Shelley Arias is a 32 y.o. female  presents for evaluation of URI symptoms for 14 days. Patient reports associated symptoms of pressure/pain with ear fullness, postnasal drip with cough, headache. Denies N/V/D, fevers, sore throat, body aches, shortness of breath. Patient does not have a hx of asthma. Patient does not have a history of smoking.  Reports sick contacts via her son.  On intake patient reported she had attended 10 headache on her way to the clinic but after she took [facility] powder headache is now a 5 out of 10 and she headache is worst headache of her life.  Pt has taken BC powder OTC for symptoms. Pt has no other concerns at this time.    Headache Associated symptoms: congestion, cough, drainage, ear pain and sinus pressure     History reviewed. No pertinent past medical history.  Patient Active Problem List   Diagnosis Date Noted   Generalized anxiety disorder with panic attacks 05/25/2018   Major depressive disorder, recurrent episode, moderate (HCC) 05/25/2018   Overweight 09/06/2014    Past Surgical History:  Procedure Laterality Date   CESAREAN SECTION      OB History     Gravida  1   Para      Term      Preterm      AB      Living  1      SAB      IAB      Ectopic      Multiple      Live Births               Home Medications    Prior to Admission medications   Medication Sig Start Date End Date Taking? Authorizing Provider  Aspirin-Salicylamide-Caffeine (BC HEADACHE POWDER PO) Take 1 Package by mouth as needed.   Yes [provider]  cetirizine (ZYRTEC ALLERGY) 10 MG tablet Take 1 tablet (10 mg total) by mouth daily. 02/11/23  Yes Brimage, Vondra, DO  doxycycline (VIBRAMYCIN) 100 MG capsule Take 1 capsule (100 mg total) by  mouth 2 (two) times daily for 10 days. 08/12/23 08/22/23 Yes Radford Pax, NP  fluticasone (FLONASE) 50 MCG/ACT nasal spray Place 1 spray into both nostrils daily. 08/12/23  Yes Radford Pax, NP  ipratropium (ATROVENT) 0.06 % nasal spray Place 2 sprays into both nostrils 4 (four) times daily. 02/11/23  Yes Brimage, Seward Meth, DO  Multiple Vitamins-Minerals (HAIR/SKIN/NAILS) CAPS Take 1 tablet by mouth 2 (two) times daily.   Yes [provider]  promethazine-dextromethorphan (PROMETHAZINE-DM) 6.25-15 MG/5ML syrup Take 5 mLs by mouth 4 (four) times daily as needed for cough. 08/12/23  Yes Radford Pax, NP    Family History Family History  Problem Relation Age of Onset   Diabetes Maternal Grandfather    Heart disease Maternal Grandfather    Hypertension Maternal Grandfather    Diabetes Paternal Grandmother    Heart disease Paternal Grandmother    Hypertension Paternal Grandmother    Diabetes Paternal Grandfather    Heart disease Paternal Grandfather    Hypertension Paternal Grandfather     Social History Social History   Tobacco Use   Smoking status: Former    Current packs/day: 0.25  Types: Cigarettes   Smokeless tobacco: Never   Tobacco comments:    12/16/2022 Reports last smoked cigarettes in 08/2022. Currently using e-cigarettes with flavoring, no nicotine.  Vaping Use   Vaping status: Former  Substance Use Topics   Alcohol use: No   Drug use: No     Allergies   Penicillins   Review of Systems Review of Systems  HENT:  Positive for congestion, ear pain, postnasal drip, sinus pressure and sinus pain.   Respiratory:  Positive for cough.   Neurological:  Positive for headaches.     Physical Exam Triage Vital Signs ED Triage Vitals  Encounter Vitals Group     BP 08/12/23 1839 112/85     Systolic BP Percentile --      Diastolic BP Percentile --      Pulse Rate 08/12/23 1839 74     Resp --      Temp 08/12/23 1839 99.1 F (37.3 C)     Temp Source  08/12/23 1839 Oral     SpO2 08/12/23 1839 99 %     Weight 08/12/23 1837 175 lb (79.4 kg)     Height 08/12/23 1837 5\' 1"  (1.549 m)     Head Circumference --      Peak Flow --      Pain Score 08/12/23 1837 5     Pain Loc --      Pain Education --      Exclude from Growth Chart --    No data found.  Updated Vital Signs BP 112/85 (BP Location: Left Arm)   Pulse 74   Temp 99.1 F (37.3 C) (Oral)   Ht 5\' 1"  (1.549 m)   Wt 175 lb (79.4 kg)   LMP 07/29/2023   SpO2 99%   BMI 33.07 kg/m   Visual Acuity Right Eye Distance:   Left Eye Distance:   Bilateral Distance:    Right Eye Near:   Left Eye Near:    Bilateral Near:     Physical Exam Vitals and nursing note reviewed.  Constitutional:      General: She is not in acute distress.    Appearance: She is well-developed. She is not ill-appearing.  HENT:     Head: Normocephalic and atraumatic.     Right Ear: Tympanic membrane and ear canal normal.     Left Ear: Ear canal normal. Tympanic membrane is erythematous.     Nose: Congestion present.     Right Turbinates: Swollen and pale.     Left Turbinates: Swollen and pale.     Right Sinus: Maxillary sinus tenderness present. No frontal sinus tenderness.     Left Sinus: Maxillary sinus tenderness present. No frontal sinus tenderness.     Mouth/Throat:     Mouth: Mucous membranes are moist.     Pharynx: Oropharynx is clear. Uvula midline. No oropharyngeal exudate or posterior oropharyngeal erythema.     Tonsils: No tonsillar exudate or tonsillar abscesses.  Eyes:     Conjunctiva/sclera: Conjunctivae normal.     Pupils: Pupils are equal, round, and reactive to light.  Cardiovascular:     Rate and Rhythm: Normal rate and regular rhythm.     Heart sounds: Normal heart sounds.  Pulmonary:     Effort: Pulmonary effort is normal.     Breath sounds: Normal breath sounds.  Musculoskeletal:     Cervical back: Normal range of motion and neck supple.  Lymphadenopathy:     Cervical: No  cervical adenopathy.  Skin:    General: Skin is warm and dry.  Neurological:     General: No focal deficit present.     Mental Status: She is alert and oriented to person, place, and time.  Psychiatric:        Mood and Affect: Mood normal.        Behavior: Behavior normal.      UC Treatments / Results  Labs (all labs ordered are listed, but only abnormal results are displayed) Labs Reviewed - No data to display  EKG   Radiology No results found.  Procedures Procedures (including critical care time)  Medications Ordered in UC Medications - No data to display  Initial Impression / Assessment and Plan / UC Course  I have reviewed the triage vital signs and the nursing notes.  Pertinent labs & imaging results that were available during my care of the patient were reviewed by me and considered in my medical decision making (see chart for details).     Reviewed exam and symptoms with patient.  No red flags.  Will start doxycycline for sinusitis/left OM.  Flonase daily.  Promethazine DM as needed for cough.  Side effect profile reviewed.  Lots of rest and fluids/nasal rinses as tolerated.  PCP follow-up if symptoms do not improve.  ER precautions reviewed and patient verbalized understanding Final Clinical Impressions(s) / UC Diagnoses   Final diagnoses:  Acute suppurative otitis media of left ear without spontaneous rupture of tympanic membrane, recurrence not specified  Acute maxillary sinusitis, recurrence not specified  Post-nasal drip     Discharge Instructions      Start doxycycline twice daily for 10 days.  Flonase daily.  Promethazine DM as needed for cough.  Presents medication make you drowsy.  Do not drink alcohol or drive on this medication.  Lots of rest and fluids.  Follow-up with your PCP if your symptoms do not improve.  Please go to the ER for any worsening symptoms.  I hope you feel better soon!   ED Prescriptions     Medication Sig Dispense Auth.  Provider   doxycycline (VIBRAMYCIN) 100 MG capsule Take 1 capsule (100 mg total) by mouth 2 (two) times daily for 10 days. 20 capsule Radford Pax, NP   promethazine-dextromethorphan (PROMETHAZINE-DM) 6.25-15 MG/5ML syrup Take 5 mLs by mouth 4 (four) times daily as needed for cough. 118 mL Radford Pax, NP   fluticasone (FLONASE) 50 MCG/ACT nasal spray Place 1 spray into both nostrils daily. 15.8 mL Radford Pax, NP      PDMP not reviewed this encounter.   Radford Pax, NP 08/12/23 [phone]

## 2023-08-12 NOTE — ED Triage Notes (Signed)
Pt c/o nasal congestion, facial pressure, right ear fullness x2weeks  Pt was around her son who was sick from daycare  Pt states that her headache was over a 10 on the drive to the urgent care but she took a BC powder and it brought down the pain.

## 2023-08-12 NOTE — Discharge Instructions (Signed)
Start doxycycline twice daily for 10 days.  Flonase daily.  Promethazine DM as needed for cough.  Presents medication make you drowsy.  Do not drink alcohol or drive on this medication.  Lots of rest and fluids.  Follow-up with your PCP if your symptoms do not improve.  Please go to the ER for any worsening symptoms.  I hope you feel better soon!

## 2024-01-02 ENCOUNTER — Telehealth: Payer: Self-pay | Admitting: Licensed Clinical Social Worker

## 2024-01-02 NOTE — Telephone Encounter (Signed)
 12/30/23 patient left voicemail requesting and appt.

## 2024-06-20 ENCOUNTER — Ambulatory Visit
Admission: EM | Admit: 2024-06-20 | Discharge: 2024-06-20 | Disposition: A | Discharge status: Home / Self Care | Attending: Emergency Medicine | Admitting: Emergency Medicine

## 2024-06-20 ENCOUNTER — Encounter: Payer: Self-pay | Admitting: Emergency Medicine

## 2024-06-20 DIAGNOSIS — J069 Acute upper respiratory infection, unspecified: Secondary | ICD-10-CM | POA: Diagnosis present

## 2024-06-20 LAB — RESP PANEL BY RT-PCR (FLU A&B, COVID) ARPGX2
Influenza A by PCR: NEGATIVE
Influenza B by PCR: NEGATIVE
SARS Coronavirus 2 by RT PCR: NEGATIVE

## 2024-06-20 MED ORDER — IPRATROPIUM BROMIDE 0.06 % NA SOLN: 2.0000 | Freq: Four times a day (QID) | NASAL | 12 refills | Status: AC

## 2024-06-20 MED ORDER — BENZONATATE 100 MG PO CAPS: 200.0000 mg | ORAL_CAPSULE | Freq: Three times a day (TID) | ORAL | 0 refills | Status: AC

## 2024-06-20 MED ORDER — PROMETHAZINE-DM 6.25-15 MG/5ML PO SYRP: 5.0000 mL | ORAL_SOLUTION | Freq: Four times a day (QID) | ORAL | 0 refills | Status: AC | PRN

## 2024-06-20 NOTE — ED Provider Notes (Signed)
 [facility]    CSN: [id number] Arrival date & time: 06/20/24  1216      History   Chief Complaint Chief Complaint  Patient presents with   Nasal Congestion    HPI Shelley Arias is a 33 y.o. female.   HPI  33 year old female with past medical history significant for recurrent MDD and generalized anxiety disorder presents for evaluation of bodyaches, runny nose, and nasal congestion that started yesterday.  She also endorses an infrequent cough.  No fever.  History reviewed. No pertinent past medical history.  Patient Active Problem List   Diagnosis Date Noted   Generalized anxiety disorder with panic attacks 05/25/2018   Major depressive disorder, recurrent episode, moderate (HCC) 05/25/2018   Overweight 09/06/2014    Past Surgical History:  Procedure Laterality Date   CESAREAN SECTION      OB History     Gravida  1   Para      Term      Preterm      AB      Living  1      SAB      IAB      Ectopic      Multiple      Live Births               Home Medications    Prior to Admission medications   Medication Sig Start Date End Date Taking? Authorizing Provider  benzonatate  (TESSALON ) 100 MG capsule Take 2 capsules (200 mg total) by mouth every 8 (eight) hours. 06/20/24  Yes Bernardino Ditch, NP  ipratropium (ATROVENT ) 0.06 % nasal spray Place 2 sprays into both nostrils 4 (four) times daily. 06/20/24  Yes Bernardino Ditch, NP  promethazine -dextromethorphan (PROMETHAZINE -DM) 6.25-15 MG/5ML syrup Take 5 mLs by mouth 4 (four) times daily as needed. 06/20/24  Yes Bernardino Ditch, NP  Aspirin-Salicylamide-Caffeine (BC HEADACHE POWDER PO) Take 1 Package by mouth as needed.    [provider]  cetirizine  (ZYRTEC  ALLERGY) 10 MG tablet Take 1 tablet (10 mg total) by mouth daily. 02/11/23   Brimage, Vondra, DO  fluticasone  (FLONASE ) 50 MCG/ACT nasal spray Place 1 spray into both nostrils daily. 08/12/23   Mayer, Jodi R, NP  Multiple  Vitamins-Minerals (HAIR/SKIN/NAILS) CAPS Take 1 tablet by mouth 2 (two) times daily.    [provider]    Family History Family History  Problem Relation Age of Onset   Diabetes Maternal Grandfather    Heart disease Maternal Grandfather    Hypertension Maternal Grandfather    Diabetes Paternal Grandmother    Heart disease Paternal Grandmother    Hypertension Paternal Grandmother    Diabetes Paternal Grandfather    Heart disease Paternal Grandfather    Hypertension Paternal Grandfather     Social History Social History   Tobacco Use   Smoking status: Former    Current packs/day: 0.25    Types: Cigarettes   Smokeless tobacco: Never   Tobacco comments:    12/16/2022 Reports last smoked cigarettes in 08/2022. Currently using e-cigarettes with flavoring, no nicotine.  Vaping Use   Vaping status: Former  Substance Use Topics   Alcohol use: No   Drug use: No     Allergies   Penicillins   Review of Systems Review of Systems  Constitutional:  Negative for fever.  HENT:  Positive for congestion and rhinorrhea. Negative for ear pain and sore throat.   Respiratory:  Positive for cough. Negative for shortness of breath and wheezing.  Physical Exam Triage Vital Signs ED Triage Vitals  Encounter Vitals Group     BP      Girls Systolic BP Percentile      Girls Diastolic BP Percentile      Boys Systolic BP Percentile      Boys Diastolic BP Percentile      Pulse      Resp      Temp      Temp src      SpO2      Weight      Height      Head Circumference      Peak Flow      Pain Score      Pain Loc      Pain Education      Exclude from Growth Chart    No data found.  Updated Vital Signs BP 121/87 (BP Location: Left Arm)   Pulse 79   Temp 98.6 F (37 C) (Oral)   Resp 18   Wt 170 lb 8 oz (77.3 kg)   LMP 06/05/2024 (Exact Date)   SpO2 96%   BMI 32.22 kg/m   Visual Acuity Right Eye Distance:   Left Eye Distance:   Bilateral Distance:     Right Eye Near:   Left Eye Near:    Bilateral Near:     Physical Exam Vitals and nursing note reviewed.  Constitutional:      Appearance: Normal appearance. She is not ill-appearing.  HENT:     Head: Normocephalic and atraumatic.     Right Ear: Tympanic membrane, ear canal and external ear normal. There is no impacted cerumen.     Left Ear: Tympanic membrane, ear canal and external ear normal. There is no impacted cerumen.     Nose: Congestion and rhinorrhea present.     Comments: Nasal mucosa is edematous and erythematous with copious clear discharge in both naris.    Mouth/Throat:     Mouth: Mucous membranes are moist.     Pharynx: Oropharynx is clear. No oropharyngeal exudate or posterior oropharyngeal erythema.  Cardiovascular:     Rate and Rhythm: Normal rate and regular rhythm.     Pulses: Normal pulses.     Heart sounds: Normal heart sounds. No murmur heard.    No friction rub. No gallop.  Pulmonary:     Effort: Pulmonary effort is normal.     Breath sounds: Normal breath sounds. No wheezing, rhonchi or rales.  Musculoskeletal:     Cervical back: Normal range of motion and neck supple. No tenderness.  Lymphadenopathy:     Cervical: No cervical adenopathy.  Skin:    General: Skin is warm and dry.     Capillary Refill: Capillary refill takes less than 2 seconds.     Findings: No rash.  Neurological:     General: No focal deficit present.     Mental Status: She is alert and oriented to person, place, and time.      UC Treatments / Results  Labs (all labs ordered are listed, but only abnormal results are displayed) Labs Reviewed  RESP PANEL BY RT-PCR (FLU A&B, COVID) [id number]    EKG   Radiology No results found.  Procedures Procedures (including critical care time)  Medications Ordered in UC Medications - No data to display  Initial Impression / Assessment and Plan / UC Course  I have reviewed the triage vital signs and the nursing notes.  Pertinent  labs & imaging results that were  available during my care of the patient were reviewed by me and considered in my medical decision making (see chart for details).   Patient is a nontoxic-appearing 33 year old female presenting for evaluation of respiratory symptoms that began yesterday.  Her son also has similar symptoms though his are worse.  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  She is unaware of any other exposure.  Differential diagnose include COVID, influenza, viral respiratory illness.  I will order a COVID and flu PCR.  Respiratory panel is negative for COVID or influenza.  I will discharge patient with a diagnosis of viral URI with a cough.  Prescribe Atrovent  nasal spray for the nasal congestion and Tessalon  Perles and Promethazine  DM cough syrup for cough and congestion.  Return precautions reviewed work provided.   Final Clinical Impressions(s) / UC Diagnoses   Final diagnoses:  Viral URI with cough     Discharge Instructions      Your testing today was negative for COVID or influenza.  Your exam is consistent with a viral respiratory infection.  Please use over-the-counter Tylenol  and/or ibuprofen  as needed for any pain or if you develop fever.  Use the Atrovent  nasal spray, 2 squirts in each nostril every 6 hours, as needed for runny nose and postnasal drip.  Use the Tessalon  Perles every 8 hours during the day.  Take them with a small sip of water.  They may give you some numbness to the base of your tongue or a metallic taste in your mouth, this is normal.  Use the Promethazine  DM cough syrup at bedtime for cough and congestion.  It will make you drowsy so do not take it during the day.  Return for reevaluation or see your primary care provider for any new or worsening symptoms.      ED Prescriptions     Medication Sig Dispense Auth. Provider   benzonatate   (TESSALON ) 100 MG capsule Take 2 capsules (200 mg total) by mouth every 8 (eight) hours. 21 capsule Bernardino Ditch, NP   ipratropium (ATROVENT ) 0.06 % nasal spray Place 2 sprays into both nostrils 4 (four) times daily. 15 mL Bernardino Ditch, NP   promethazine -dextromethorphan (PROMETHAZINE -DM) 6.25-15 MG/5ML syrup Take 5 mLs by mouth 4 (four) times daily as needed. 118 mL Bernardino Ditch, NP      PDMP not reviewed this encounter.   Bernardino Ditch, NP 06/20/24 1351

## 2024-06-20 NOTE — ED Triage Notes (Addendum)
 Sx x 1 days  Nasal congestion Bodyaches

## 2024-06-20 NOTE — Discharge Instructions (Addendum)
 Your testing today was negative for COVID or influenza.  Your exam is consistent with a viral respiratory infection.  Please use over-the-counter Tylenol  and/or ibuprofen  as needed for any pain or if you develop fever.  Use the Atrovent  nasal spray, 2 squirts in each nostril every 6 hours, as needed for runny nose and postnasal drip.  Use the Tessalon  Perles every 8 hours during the day.  Take them with a small sip of water.  They may give you some numbness to the base of your tongue or a metallic taste in your mouth, this is normal.  Use the Promethazine  DM cough syrup at bedtime for cough and congestion.  It will make you drowsy so do not take it during the day.  Return for reevaluation or see your primary care provider for any new or worsening symptoms.

## 2024-07-09 ENCOUNTER — Other Ambulatory Visit: Payer: Self-pay

## 2024-07-09 ENCOUNTER — Emergency Department
Admission: EM | Admit: 2024-07-09 | Discharge: 2024-07-09 | Discharge status: Against Medical Advice | Attending: Emergency Medicine | Admitting: Emergency Medicine

## 2024-07-09 DIAGNOSIS — Z5321 Procedure and treatment not carried out due to patient leaving prior to being seen by health care provider: Secondary | ICD-10-CM | POA: Diagnosis not present

## 2024-07-09 DIAGNOSIS — R519 Headache, unspecified: Secondary | ICD-10-CM | POA: Insufficient documentation

## 2024-07-09 NOTE — ED Notes (Signed)
 Registration saw patient walk out to hallway. Dr. Levander aware.

## 2024-07-09 NOTE — ED Triage Notes (Signed)
 Headache x 3 weeks.  Ibuprofen  taken yesterday and day before yesterday for headache.

## 2024-07-09 NOTE — ED Notes (Signed)
 Patient is not back on the recliner at this time.

## 2024-07-10 ENCOUNTER — Ambulatory Visit (LOCAL_COMMUNITY_HEALTH_CENTER)

## 2024-07-10 ENCOUNTER — Encounter

## 2024-07-10 VITALS — BP 113/73 | Ht 61.0 in | Wt 172.5 lb

## 2024-07-10 DIAGNOSIS — Z309 Encounter for contraceptive management, unspecified: Secondary | ICD-10-CM | POA: Diagnosis not present

## 2024-07-10 DIAGNOSIS — Z3201 Encounter for pregnancy test, result positive: Secondary | ICD-10-CM | POA: Diagnosis not present

## 2024-07-10 LAB — PREGNANCY, URINE: Preg Test, Ur: POSITIVE — AB

## 2024-07-10 MED ORDER — PRENATAL 27-0.8 MG PO TABS: 1.0000 | ORAL_TABLET | Freq: Every day | ORAL | Status: AC

## 2024-07-10 NOTE — Progress Notes (Signed)
 UPT positive. Plans prenatal care at [facility]. Positive preg packet given and reviewed.   The patient was dispensed prenatal vitamins #100 today per [facility] Dr JAYSON Helling. I provided counseling today regarding the medication. We discussed the medication, the side effects and when to call clinic. Patient given the opportunity to ask questions. Questions answered.    Sent to clerk for presumptive elig/medicaid/preg women and new OB appt. Jex Strausbaugh, RN

## 2024-07-12 ENCOUNTER — Encounter

## 2024-08-09 ENCOUNTER — Ambulatory Visit
Admission: EM | Admit: 2024-08-09 | Discharge: 2024-08-09 | Disposition: A | Discharge status: Home / Self Care | Attending: Emergency Medicine | Admitting: Emergency Medicine

## 2024-08-09 DIAGNOSIS — Z3A09 9 weeks gestation of pregnancy: Secondary | ICD-10-CM | POA: Diagnosis not present

## 2024-08-09 DIAGNOSIS — B349 Viral infection, unspecified: Secondary | ICD-10-CM

## 2024-08-09 DIAGNOSIS — O98511 Other viral diseases complicating pregnancy, first trimester: Secondary | ICD-10-CM

## 2024-08-09 DIAGNOSIS — O26891 Other specified pregnancy related conditions, first trimester: Secondary | ICD-10-CM | POA: Diagnosis present

## 2024-08-09 HISTORY — DX: 9 weeks gestation of pregnancy: Z3A.09

## 2024-08-09 HISTORY — DX: Viral infection, unspecified: B34.9

## 2024-08-09 LAB — SARS CORONAVIRUS 2 BY RT PCR: SARS Coronavirus 2 by RT PCR: NEGATIVE

## 2024-08-09 NOTE — Discharge Instructions (Addendum)
Your covid test is negative. Most likely you have a viral illness: no antibiotic is indicated at this time, May treat with OTC meds of choice. Make sure to drink plenty of fluids to stay hydrated(gatorade, water, popsicles,jello,etc), avoid caffeine products. Follow up with PCP. Return as needed.

## 2024-08-09 NOTE — ED Provider Notes (Signed)
 [facility]    CSN: [id number] Arrival date & time: 08/09/24  9071      History   Chief Complaint Chief Complaint  Patient presents with   Headache   Cough   Laryngitis         HPI Shelley Arias is a 33 y.o. female.   33 year old female, Shelley Arias, presents to care for evaluation of headache, sore throat ,loss of voice, runny nose. Pt is here with son with similar s/s. No treatment tried, taken daily prenatal vitamin.  Patient states she is eating and drinking well, voiding well  Patient is [redacted] weeks pregnant  The history is provided by the patient. No language interpreter was used.    Past Medical History:  Diagnosis Date   Patient denies medical problems     Patient Active Problem List   Diagnosis Date Noted   [redacted] weeks gestation of pregnancy 08/09/2024   Viral illness 08/09/2024   Generalized anxiety disorder with panic attacks 05/25/2018   Major depressive disorder, recurrent episode, moderate (HCC) 05/25/2018   Overweight 09/06/2014    Past Surgical History:  Procedure Laterality Date   CESAREAN SECTION  06/15/2013    OB History     Gravida  2   Para  1   Term  1   Preterm  0   AB  0   Living  1      SAB  0   IAB  0   Ectopic  0   Multiple  0   Live Births  1            Home Medications    Prior to Admission medications   Medication Sig Start Date End Date Taking? Authorizing Provider  Prenatal Vit-Fe Fumarate-FA (MULTIVITAMIN-PRENATAL) 27-0.8 MG TABS tablet Take 1 tablet by mouth daily at 12 noon. 07/10/24 10/18/24 Yes Macario Dorothyann HERO, MD  Aspirin-Salicylamide-Caffeine [facility] HEADACHE POWDER PO) Take 1 Package by mouth as needed. Patient not taking: No sig reported    [provider]  benzonatate  (TESSALON ) 100 MG capsule Take 2 capsules (200 mg total) by mouth every 8 (eight) hours. Patient not taking: No sig reported 06/20/24   Bernardino Ditch, NP  cetirizine  (ZYRTEC  ALLERGY) 10 MG tablet Take 1  tablet (10 mg total) by mouth daily. Patient not taking: No sig reported 02/11/23   Brimage, Vondra, DO  fluticasone  (FLONASE ) 50 MCG/ACT nasal spray Place 1 spray into both nostrils daily. 08/12/23   Mayer, Jodi R, NP  ipratropium (ATROVENT ) 0.06 % nasal spray Place 2 sprays into both nostrils 4 (four) times daily. Patient not taking: No sig reported 06/20/24   Bernardino Ditch, NP  Multiple Vitamins-Minerals (HAIR/SKIN/NAILS) CAPS Take 1 tablet by mouth 2 (two) times daily. Patient not taking: No sig reported    [provider]  promethazine -dextromethorphan (PROMETHAZINE -DM) 6.25-15 MG/5ML syrup Take 5 mLs by mouth 4 (four) times daily as needed. Patient not taking: No sig reported 06/20/24   Bernardino Ditch, NP    Family History Family History  Problem Relation Age of Onset   Diabetes Maternal Grandfather    Heart disease Maternal Grandfather    Hypertension Maternal Grandfather    Diabetes Paternal Grandmother    Heart disease Paternal Grandmother    Hypertension Paternal Grandmother    Diabetes Paternal Grandfather    Heart disease Paternal Grandfather    Hypertension Paternal Grandfather     Social History Social History   Tobacco Use   Smoking status:  Former    Current packs/day: 0.25    Types: Cigarettes   Smokeless tobacco: Never   Tobacco comments:    Stopped cigarettes 07/06/2024. Stopped vaping 2024  Vaping Use   Vaping status: Former   Substances: Nicotine, Flavoring  Substance Use Topics   Alcohol use: Not Currently    Comment: last use 04/2024   Drug use: Never     Allergies   Penicillins   Review of Systems Review of Systems  Constitutional:  Negative for fever.  HENT:  Positive for congestion, rhinorrhea, sneezing and voice change.   Respiratory:  Positive for cough. Negative for shortness of breath, wheezing and stridor.   Cardiovascular:  Positive for chest pain.  Gastrointestinal:  Negative for abdominal pain.  Genitourinary:  Negative for  dysuria and vaginal bleeding.  Neurological:  Positive for headaches.  All other systems reviewed and are negative.    Physical Exam Triage Vital Signs ED Triage Vitals  Encounter Vitals Group     BP      Girls Systolic BP Percentile      Girls Diastolic BP Percentile      Boys Systolic BP Percentile      Boys Diastolic BP Percentile      Pulse      Resp      Temp      Temp src      SpO2      Weight      Height      Head Circumference      Peak Flow      Pain Score      Pain Loc      Pain Education      Exclude from Growth Chart    No data found.  Updated Vital Signs BP 128/82 (BP Location: Left Arm)   Pulse 79   Temp 98.6 F (37 C) (Oral)   Wt 177 lb (80.3 kg)   LMP 06/05/2024 (Exact Date)   SpO2 93%   BMI 33.44 kg/m   Visual Acuity Right Eye Distance:   Left Eye Distance:   Bilateral Distance:    Right Eye Near:   Left Eye Near:    Bilateral Near:     Physical Exam Vitals and nursing note reviewed.  Constitutional:      General: She is not in acute distress.    Appearance: She is well-developed.  HENT:     Head: Normocephalic.     Right Ear: Tympanic membrane is retracted.     Left Ear: Tympanic membrane is retracted.     Nose: Congestion present.     Mouth/Throat:     Lips: Pink.     Mouth: Mucous membranes are moist.     Pharynx: Oropharynx is clear. Uvula midline. Postnasal drip present.  Eyes:     General: Lids are normal.     Conjunctiva/sclera: Conjunctivae normal.     Pupils: Pupils are equal, round, and reactive to light.  Neck:     Trachea: No tracheal deviation.  Cardiovascular:     Rate and Rhythm: Normal rate and regular rhythm.     Heart sounds: Normal heart sounds. No murmur heard. Pulmonary:     Effort: Pulmonary effort is normal.     Breath sounds: Normal breath sounds and air entry.  Abdominal:     General: Bowel sounds are normal.     Palpations: Abdomen is soft.     Tenderness: There is no abdominal tenderness.   Musculoskeletal:  General: Normal range of motion.     Cervical back: Normal range of motion.  Lymphadenopathy:     Cervical: No cervical adenopathy.  Skin:    General: Skin is warm and dry.     Findings: No rash.  Neurological:     General: No focal deficit present.     Mental Status: She is alert and oriented to person, place, and time.     GCS: GCS eye subscore is 4. GCS verbal subscore is 5. GCS motor subscore is 6.  Psychiatric:        Speech: Speech normal.        Behavior: Behavior normal. Behavior is cooperative.      UC Treatments / Results  Labs (all labs ordered are listed, but only abnormal results are displayed) Labs Reviewed  SARS CORONAVIRUS 2 BY RT PCR    EKG   Radiology No results found.  Procedures Procedures (including critical care time)  Medications Ordered in UC Medications - No data to display  Initial Impression / Assessment and Plan / UC Course  I have reviewed the triage vital signs and the nursing notes.  Pertinent labs & imaging results that were available during my care of the patient were reviewed by me and considered in my medical decision making (see chart for details).    Discussed exam findings and plan of care with patient, strict go to ER precautions given.   Patient verbalized understanding to this provider.  Ddx: Viral illness, pregnant Final Clinical Impressions(s) / UC Diagnoses   Final diagnoses:  [redacted] weeks gestation of pregnancy  Viral illness     Discharge Instructions      Your covid test is negative. Most likely you have a viral illness: no antibiotic is indicated at this time, May treat with OTC meds of choice. Make sure to drink plenty of fluids to stay hydrated(gatorade, water, popsicles,jello,etc), avoid caffeine products. Follow up with PCP. Return as needed.     ED Prescriptions   None    PDMP not reviewed this encounter.   Aminta Loose, NP 08/09/24 2114

## 2024-08-09 NOTE — ED Triage Notes (Signed)
 Pt c/o headache, right ear loss of hearing, loss of voice, nasal congestion x4days

## 2024-08-21 ENCOUNTER — Encounter: Payer: Self-pay | Admitting: Family Medicine

## 2024-08-21 ENCOUNTER — Ambulatory Visit: Admitting: Family Medicine

## 2024-08-21 VITALS — BP 103/69 | HR 79 | Temp 97.6°F | Wt 177.2 lb

## 2024-08-21 DIAGNOSIS — O99211 Obesity complicating pregnancy, first trimester: Secondary | ICD-10-CM

## 2024-08-21 DIAGNOSIS — F410 Panic disorder [episodic paroxysmal anxiety] without agoraphobia: Secondary | ICD-10-CM

## 2024-08-21 DIAGNOSIS — O99210 Obesity complicating pregnancy, unspecified trimester: Secondary | ICD-10-CM | POA: Insufficient documentation

## 2024-08-21 DIAGNOSIS — Z3481 Encounter for supervision of other normal pregnancy, first trimester: Secondary | ICD-10-CM

## 2024-08-21 DIAGNOSIS — Z23 Encounter for immunization: Secondary | ICD-10-CM | POA: Diagnosis not present

## 2024-08-21 DIAGNOSIS — F331 Major depressive disorder, recurrent, moderate: Secondary | ICD-10-CM | POA: Diagnosis not present

## 2024-08-21 DIAGNOSIS — F411 Generalized anxiety disorder: Secondary | ICD-10-CM

## 2024-08-21 DIAGNOSIS — Z3A11 11 weeks gestation of pregnancy: Secondary | ICD-10-CM

## 2024-08-21 DIAGNOSIS — Z3480 Encounter for supervision of other normal pregnancy, unspecified trimester: Secondary | ICD-10-CM | POA: Insufficient documentation

## 2024-08-21 LAB — HEMOGLOBIN, FINGERSTICK: Hemoglobin: 11.5 g/dL (ref 11.1–15.9)

## 2024-08-21 NOTE — Progress Notes (Addendum)
 Here for New OB visit. Lives with her son. FOB lives in [place] and may be involved with child. Needs BMI labs today and wants flu vaccine. Desires Maternit21 today. Needs Pap test. .Izetta Parish, RN   Flu vaccine given, tolerated well, VIS and NCIR given. U/S scheduled for 08/28/24 at 5:00. Patient aware to arrive with full bladder at 4:45pm. Hgb 11.5 today.Hulan Parish, RN

## 2024-08-21 NOTE — Patient Instructions (Addendum)
 Safe Medications in Pregnancy   Acne: Benzoyl Peroxide Salicylic Acid  Backache/Headache: Tylenol: 2 regular strength every 4 hours OR              2 Extra strength every 6 hours  Colds/Coughs/Allergies: Benadryl (alcohol free) 25 mg every 6 hours as needed Breath right strips Claritin Cepacol throat lozenges Chloraseptic throat spray Cold-Eeze- up to three times per day Cough drops, alcohol free Flonase (by prescription only) Guaifenesin Mucinex Robitussin DM (plain only, alcohol free) Saline nasal spray/drops Sudafed (pseudoephedrine) & Actifed ** use only after [redacted] weeks gestation and if you do not have high blood pressure Tylenol Vicks Vaporub Zinc lozenges Zyrtec   Constipation: Colace Ducolax suppositories Fleet enema Glycerin suppositories Metamucil Milk of magnesia Miralax Senokot Smooth move tea  Diarrhea: Kaopectate Imodium A-D  *NO pepto Bismol  Hemorrhoids: Anusol Anusol HC Preparation H Tucks  Indigestion: Tums Maalox Mylanta Cimetidine (Tagamet HB)** preferred in pregnancy Famotidine (Pepcid) Ranitidine (Zantac)  Insomnia: Benadryl (alcohol free) 25mg  every 6 hours as needed Tylenol PM Unisom, no Gelcaps  Leg Cramps: Tums MagGel  Nausea/Vomiting:  Bonine Dramamine Emetrol Ginger extract Sea bands Meclizine   Nausea medication to take during pregnancy:  Unisom (doxylamine succinate 25 mg tablets) Take one tablet daily at bedtime. If symptoms are not adequately controlled, the dose can be increased to a maximum recommended dose of two tablets daily (1/2 tablet in the morning, 1/2 tablet mid-afternoon and one at bedtime). Vitamin B6 100mg  tablets. Take one tablet twice a day (up to 200 mg per day).  Skin Rashes: Aveeno products Benadryl cream or 25mg  every 6 hours as needed Calamine Lotion 1% cortisone cream  Yeast infection: Gyne-lotrimin 7 Monistat 7   **If taking multiple medications, please check labels to avoid  duplicating the same active ingredients **take medication as directed on the label ** Do not exceed 4000 mg of tylenol in 24 hours **Do not take medications that contain aspirin or ibuprofen

## 2024-08-21 NOTE — Progress Notes (Signed)
 [facility] [address], [address] [place] [address] Main phone: [phone]  Initial Prenatal Visit  Subjective:  Shelley Arias is a 33 y.o. G2P1001 at [redacted]w[redacted]d being seen today to start prenatal care at the [facility] Department. The following medical issues will be considered in the care of this low-risk pregnancy:   Patient Active Problem List   Diagnosis Date Noted   Supervision of other normal pregnancy, antepartum 08/21/2024   Obesity in pregnancy Pregravid BMI=33 08/21/2024   Generalized anxiety disorder with panic attacks 05/25/2018   Major depressive disorder, recurrent episode, moderate (HCC) 05/25/2018   Overweight 09/06/2014   Patient reports some breast tenderness, and intermittent menstrual like cramps. Pt Denies leaking of fluid.   Indications for ASA therapy One of the following: Previous pregnancy with preeclampsia, especially early onset and with an adverse outcome No  Multifetal gestation No  Chronic hypertension No  Type 1 or 2 diabetes mellitus No  Chronic kidney disease No  Autoimmune disease (antiphospholipid syndrome, systemic lupus erythematosus) No   Two or more of the following: Nulliparity  No  Obesity (body mass index >30 kg/m2) Yes  Family history of preeclampsia in mother or sister No  Age >=35 years No  Sociodemographic characteristics (African American race, low socioeconomic level) Yes  Personal risk factors (eg, previous pregnancy with low birth weight or small for gestational age infant, previous adverse pregnancy outcome [eg, stillbirth], interval >10 years between pregnancies) No   The following portions of the patient's history were reviewed and updated as appropriate: allergies, current medications, past family history, past medical history, past social history, past surgical history and problem list. Problem list updated.  Objective:   Vitals:   08/21/24 0929   BP: 103/69  Pulse: 79  Temp: 97.6 F (36.4 C)  Weight: 177 lb 3.2 oz (80.4 kg)   Fetal Status: Fetal Heart Rate (bpm): 162 Fundal Height:  (Fundus felt at symphysis pubis)       Physical Exam Vitals and nursing note reviewed. Exam conducted with a chaperone present Earvin Bers, [place]).  Constitutional:      General: She is not in acute distress.    Appearance: Normal appearance. She is well-developed.  HENT:     Head: Normocephalic and atraumatic.     Nose: Nose normal. No congestion or rhinorrhea.     Mouth/Throat:     Lips: Pink.     Mouth: Mucous membranes are moist.     Dentition: Normal dentition. No dental caries.     Pharynx: Oropharynx is clear. Uvula midline.     Comments: Dentition: good, no carries noted Eyes:     General: No scleral icterus.    Conjunctiva/sclera: Conjunctivae normal.  Neck:     Thyroid: No thyroid mass or thyromegaly.  Cardiovascular:     Rate and Rhythm: Normal rate and regular rhythm.     Pulses: Normal pulses.     Heart sounds: Normal heart sounds.     Comments: Extremities are warm and well perfused Pulmonary:     Effort: Pulmonary effort is normal.     Breath sounds: Normal breath sounds.  Chest:     Chest wall: No mass.  Breasts:    Tanner Score is 5.     Breasts are symmetrical.     Right: Normal. No mass, nipple discharge or skin change.     Left: Normal. No mass, nipple discharge or skin change.  Abdominal:  General: Abdomen is flat.     Palpations: Abdomen is soft.     Tenderness: There is no abdominal tenderness.     Comments: Gravid   Genitourinary:    General: Normal vulva.     Exam position: Lithotomy position.     Pubic Area: No rash.      Labia:        Right: No rash, tenderness or lesion.        Left: No rash, tenderness or lesion.      Vagina: Vaginal discharge (White, thin, discharge noted) present.     Cervix: Normal.     Comments: Collected Pap today  Musculoskeletal:     Right lower leg: No edema.      Left lower leg: No edema.  Lymphadenopathy:     Cervical: No cervical adenopathy.     Upper Body:     Right upper body: No axillary adenopathy.     Left upper body: No axillary adenopathy.  Skin:    General: Skin is warm.     Capillary Refill: Capillary refill takes less than 2 seconds.  Neurological:     General: No focal deficit present.     Mental Status: She is alert.  Psychiatric:        Mood and Affect: Mood normal.        Behavior: Behavior normal.    Assessment and Plan:  Pregnancy: G2P1001 at [redacted]w[redacted]d  1. [redacted] weeks gestation of pregnancy (Primary) -RV in 4 weeks.  2. Supervision of other normal pregnancy, antepartum -Pt sure of LMP, but would like a US . Pt has had regular periods prior to pregnancy. Dating US  placed.  - NV only when taking PNV, provided with information on switching to PNV gummy to provide GI relief.    -Discussed ginger tea, eating meals high in protein and adequate hydration.   - Last pap on record  09/17/2013 NILM, Collected pap today.    -Denies alcohol and drug use.    -Pt received dental care last in 2023. Patient provided with education about routine dental screening in pregnancy.   -Pt will f/u with dentist for routine exam.   -Patient received Flu vaccine today  -ASA recommendation discussed pt politely declines. Pt was counseled appropriately regarding preeclampsia and rationale for ASA indications in pregnancy.  -total weight gain of 7 lb 3.2 oz (3.266 kg)   -EDPS= 8, offered [facility] counseling services. Pt politely declined, will continue to monitor.   -CMHRP Screening forms reviewed, no concerns present at this time.   - Varicella zoster antibody, IgG - MaterniT21  plus Core+ESS+SCA, Blood - Pregnancy, Initial Screen - Hemoglobin, venipuncture - US  OB LESS THAN 14 WEEKS WITH OB TRANSVAGINAL; Future - IGP, Aptima HPV - Flu vaccine trivalent PF, 6mos and older(Flulaval,Afluria,Fluarix,Fluzone)   3. Obesity in  pregnancy -Reviewed with pt approximate weight gain ~11-20 lbs.  - Discussed benefits of regular exercise and a balanced diet to support healthy pregnancy weight gain.  - Offered MNT, pt declined for now.   - Comprehensive metabolic panel with GFR - Hgb Fractionation Cascade - Glucose, 1 hour - Protein / creatinine ratio, urine - TSH - Hgb A1c w/o eAG  4. Major depressive disorder, recurrent episode, moderate (HCC) - Pt reports dx 4-5 years ago.  -Never on any meds for MDD. -Pt doesn't feel like she needs any medication to manage at this time.   5. Generalized anxiety disorder with panic attacks -Pt feels fine at this time.  -She  used to see Alan here at [facility].  -Pt declined counseling services at visit today.   Discussed overview of care and coordination with inpatient delivery practices including [facility],  [facility].   Pt declined Centering pregnancy as standard of care at [facility] at this time due to work schedule.   Preterm labor symptoms and general obstetric precautions including but not limited to vaginal bleeding, contractions, leaking of fluid and fetal movement were reviewed in detail with the patient.  Please refer to After Visit Summary for other counseling recommendations.   Return in about 4 weeks (around 09/18/2024) for routine prenatal care.  Future Appointments  Date Time Provider Department Center  08/28/2024  5:00 PM [facility]  3 [facility]  [facility]  09/18/2024  8:40 AM [facility] PROVIDER [facility] None    Malva Diesing GORMAN Pouch, NP  Attestation of Supervision of Advanced Practitioner (CNM/PA/NP): Evaluation and management procedures were performed by the Advanced Practice Provider under my supervision and collaboration.  I have reviewed the Advanced Practice Provider's note and chart, and I agree with the management and plan. I have also made any necessary editorial changes.   I was working along side this practitioner all day and all medical plans  were discussed with me.   Verneta Bers, [place]

## 2024-08-22 LAB — PROTEIN / CREATININE RATIO, URINE
Creatinine, Urine: 123.1 mg/dL
Protein, Ur: 12.5 mg/dL
Protein/Creat Ratio: 102 mg/g{creat} (ref 0–200)

## 2024-08-23 ENCOUNTER — Ambulatory Visit: Payer: Self-pay

## 2024-08-23 DIAGNOSIS — A599 Trichomoniasis, unspecified: Secondary | ICD-10-CM

## 2024-08-23 DIAGNOSIS — R87612 Low grade squamous intraepithelial lesion on cytologic smear of cervix (LGSIL): Secondary | ICD-10-CM

## 2024-08-23 DIAGNOSIS — Z3480 Encounter for supervision of other normal pregnancy, unspecified trimester: Secondary | ICD-10-CM

## 2024-08-23 NOTE — Progress Notes (Signed)
 Normal, Reviewed. No actions indicated.  Shelley Arias, [facility]

## 2024-08-23 NOTE — Progress Notes (Signed)
 Normal. No action needed at this time.   Shelley Arias, [facility]

## 2024-08-26 LAB — PREGNANCY, INITIAL SCREEN
Antibody Screen: NEGATIVE
Basophils Absolute: 0 x10E3/uL (ref 0.0–0.2)
Basos: 0 %
Bilirubin, UA: NEGATIVE
Chlamydia trachomatis, NAA: NEGATIVE
EOS (ABSOLUTE): 0.1 x10E3/uL (ref 0.0–0.4)
Eos: 1 %
Glucose, UA: NEGATIVE
HCV Ab: NONREACTIVE
HIV Screen 4th Generation wRfx: NONREACTIVE
Hematocrit: 33.5 % — ABNORMAL LOW (ref 34.0–46.6)
Hemoglobin: 11.3 g/dL (ref 11.1–15.9)
Hepatitis B Surface Ag: NEGATIVE
Immature Grans (Abs): 0.1 x10E3/uL (ref 0.0–0.1)
Immature Granulocytes: 1 %
Ketones, UA: NEGATIVE
Lymphocytes Absolute: 1.4 x10E3/uL (ref 0.7–3.1)
Lymphs: 16 %
MCH: 32.5 pg (ref 26.6–33.0)
MCHC: 33.7 g/dL (ref 31.5–35.7)
MCV: 96 fL (ref 79–97)
Monocytes Absolute: 0.6 x10E3/uL (ref 0.1–0.9)
Monocytes: 7 %
Neisseria Gonorrhoeae by PCR: NEGATIVE
Neutrophils Absolute: 6.7 x10E3/uL (ref 1.4–7.0)
Neutrophils: 75 %
Nitrite, UA: NEGATIVE
Platelets: 262 x10E3/uL (ref 150–450)
RBC, UA: NEGATIVE
RBC: 3.48 x10E6/uL — ABNORMAL LOW (ref 3.77–5.28)
RDW: 12.1 % (ref 11.7–15.4)
RPR Ser Ql: NONREACTIVE
Rh Factor: POSITIVE
Rubella Antibodies, IGG: <0.9 {index} — ABNORMAL LOW (ref >0.99)
Specific Gravity, UA: 1.02 (ref 1.005–1.030)
Urobilinogen, Ur: 0.2 mg/dL (ref 0.2–1.0)
WBC: 8.9 x10E3/uL (ref 3.4–10.8)
pH, UA: 8 — ABNORMAL HIGH (ref 5.0–7.5)

## 2024-08-26 LAB — MATERNIT21  PLUS CORE+ESS+SCA, BLOOD
11q23 deletion (Jacobsen): NOT DETECTED
15q11 deletion (PW Angelman): NOT DETECTED
1p36 deletion syndrome: NOT DETECTED
22q11 deletion (DiGeorge): NOT DETECTED
4p16 deletion(Wolf-Hirschhorn): NOT DETECTED
5p15 deletion (Cri-du-chat): NOT DETECTED
8q24 deletion (Langer-Giedion): NOT DETECTED
Fetal Fraction: 19
Monosomy X (Turner Syndrome): NOT DETECTED
Result (T21): NEGATIVE
Trisomy 13 (Patau syndrome): NEGATIVE
Trisomy 16: NOT DETECTED
Trisomy 18 (Edwards syndrome): NEGATIVE
Trisomy 21 (Down syndrome): NEGATIVE
Trisomy 22: NOT DETECTED
XXX (Triple X Syndrome): NOT DETECTED
XXY (Klinefelter Syndrome): NOT DETECTED
XYY (Jacobs Syndrome): NOT DETECTED

## 2024-08-26 LAB — COMPREHENSIVE METABOLIC PANEL WITH GFR
ALT: 10 IU/L (ref 0–32)
AST: 16 IU/L (ref 0–40)
Albumin: 3.6 g/dL — ABNORMAL LOW (ref 3.9–4.9)
Alkaline Phosphatase: 51 IU/L (ref 41–116)
BUN/Creatinine Ratio: 12 (ref 9–23)
BUN: 6 mg/dL (ref 6–20)
Bilirubin Total: <0.2 mg/dL (ref 0.0–1.2)
CO2: 17 mmol/L — ABNORMAL LOW (ref 20–29)
Calcium: 9.3 mg/dL (ref 8.7–10.2)
Chloride: 103 mmol/L (ref 96–106)
Creatinine, Ser: 0.49 mg/dL — ABNORMAL LOW (ref 0.57–1.00)
Globulin, Total: 3.1 g/dL (ref 1.5–4.5)
Glucose: 73 mg/dL (ref 70–99)
Potassium: 3.9 mmol/L (ref 3.5–5.2)
Sodium: 137 mmol/L (ref 134–144)
Total Protein: 6.7 g/dL (ref 6.0–8.5)
eGFR: 129 mL/min/1.73 (ref >59)

## 2024-08-26 LAB — VARICELLA ZOSTER ANTIBODY, IGG: Varicella zoster IgG: NONREACTIVE

## 2024-08-26 LAB — HGB FRACTIONATION CASCADE
Hgb A2: 2.5 % (ref 1.8–3.2)
Hgb A: 97.5 % (ref 96.4–98.8)
Hgb F: 0 % (ref 0.0–2.0)
Hgb S: 0 %

## 2024-08-26 LAB — TSH: TSH: 0.363 u[IU]/mL — ABNORMAL LOW (ref 0.450–4.500)

## 2024-08-26 LAB — MICROSCOPIC EXAMINATION
Casts: NONE SEEN /LPF
Epithelial Cells (non renal): >10 /HPF — AB (ref 0–10)

## 2024-08-26 LAB — URINE CULTURE, OB REFLEX

## 2024-08-26 LAB — HCV INTERPRETATION

## 2024-08-26 LAB — GLUCOSE, 1 HOUR GESTATIONAL: Gestational Diabetes Screen: 81 mg/dL (ref 70–139)

## 2024-08-26 LAB — HGB A1C W/O EAG: Hgb A1c MFr Bld: 5.5 % (ref 4.8–5.6)

## 2024-08-27 ENCOUNTER — Encounter: Payer: Self-pay | Admitting: Family Medicine

## 2024-08-27 ENCOUNTER — Telehealth: Payer: Self-pay

## 2024-08-27 ENCOUNTER — Other Ambulatory Visit: Payer: Self-pay | Admitting: Family Medicine

## 2024-08-27 DIAGNOSIS — A599 Trichomoniasis, unspecified: Secondary | ICD-10-CM | POA: Insufficient documentation

## 2024-08-27 DIAGNOSIS — R87612 Low grade squamous intraepithelial lesion on cytologic smear of cervix (LGSIL): Secondary | ICD-10-CM | POA: Insufficient documentation

## 2024-08-27 LAB — IGP, APTIMA HPV
HPV Aptima: POSITIVE — AB
PAP Smear Comment: 0

## 2024-08-27 MED ORDER — METRONIDAZOLE 500 MG PO TABS: 500.0000 mg | ORAL_TABLET | Freq: Two times a day (BID) | ORAL | 0 refills | Status: AC

## 2024-08-27 NOTE — Progress Notes (Signed)
 Referral sent to [facility]. Message sent to pt via mychart.

## 2024-08-27 NOTE — Addendum Note (Signed)
 Addended by: MYRNA MORT R on: 08/27/2024 01:58 PM   Modules accepted: Orders

## 2024-08-27 NOTE — Progress Notes (Signed)
 1. Trichomoniasis (Primary) Trich present on patient's pap smear. RN called patient and they desired Rx sent to [facility].   - metroNIDAZOLE  (FLAGYL ) 500 MG tablet; Take 1 tablet (500 mg total) by mouth 2 (two) times daily for 7 days.  Dispense: 14 tablet; Refill: 0   [facility] FNP-C

## 2024-08-27 NOTE — Telephone Encounter (Signed)
 Patient called and notified that lab result positive for trichomonas, a sexually transmitted infection. Patient aware that partner(s) need treatment as well. Patient plans to pick up prescription at [facility]  BTHIELE RN

## 2024-08-28 ENCOUNTER — Ambulatory Visit
Admission: RE | Admit: 2024-08-28 | Discharge: 2024-08-28 | Disposition: A | Discharge status: Home / Self Care | Source: Ambulatory Visit

## 2024-08-28 ENCOUNTER — Other Ambulatory Visit: Payer: Self-pay

## 2024-08-28 DIAGNOSIS — Z3A12 12 weeks gestation of pregnancy: Secondary | ICD-10-CM | POA: Insufficient documentation

## 2024-08-28 DIAGNOSIS — F331 Major depressive disorder, recurrent, moderate: Secondary | ICD-10-CM

## 2024-08-28 DIAGNOSIS — Z3481 Encounter for supervision of other normal pregnancy, first trimester: Secondary | ICD-10-CM | POA: Insufficient documentation

## 2024-08-28 DIAGNOSIS — Z3480 Encounter for supervision of other normal pregnancy, unspecified trimester: Secondary | ICD-10-CM | POA: Diagnosis present

## 2024-08-28 DIAGNOSIS — Z3A11 11 weeks gestation of pregnancy: Secondary | ICD-10-CM

## 2024-08-28 DIAGNOSIS — F410 Panic disorder [episodic paroxysmal anxiety] without agoraphobia: Secondary | ICD-10-CM

## 2024-08-28 DIAGNOSIS — O99210 Obesity complicating pregnancy, unspecified trimester: Secondary | ICD-10-CM

## 2024-09-03 ENCOUNTER — Telehealth: Payer: Self-pay | Admitting: Family Medicine

## 2024-09-03 NOTE — Telephone Encounter (Signed)
 Returned patient phone call. Child answered phone and stated my mom's at work. Left message for her to return phone call to health department. BTHIELE RN

## 2024-09-13 ENCOUNTER — Telehealth: Payer: Self-pay | Admitting: Family Medicine

## 2024-09-14 NOTE — Telephone Encounter (Signed)
 Returned phone call. Patient states she received advice from pharmacist, is taking now the Prenatal Gummy. BTHIELE RN

## 2024-09-18 ENCOUNTER — Encounter: Payer: Self-pay | Admitting: Family Medicine

## 2024-09-18 ENCOUNTER — Ambulatory Visit: Admitting: Family Medicine

## 2024-09-18 VITALS — BP 94/61 | HR 80 | Temp 98.9°F | Wt 180.4 lb

## 2024-09-18 DIAGNOSIS — O99212 Obesity complicating pregnancy, second trimester: Secondary | ICD-10-CM

## 2024-09-18 DIAGNOSIS — O99210 Obesity complicating pregnancy, unspecified trimester: Secondary | ICD-10-CM

## 2024-09-18 DIAGNOSIS — R87612 Low grade squamous intraepithelial lesion on cytologic smear of cervix (LGSIL): Secondary | ICD-10-CM

## 2024-09-18 DIAGNOSIS — Z3480 Encounter for supervision of other normal pregnancy, unspecified trimester: Secondary | ICD-10-CM

## 2024-09-18 DIAGNOSIS — R7989 Other specified abnormal findings of blood chemistry: Secondary | ICD-10-CM

## 2024-09-18 DIAGNOSIS — F411 Generalized anxiety disorder: Secondary | ICD-10-CM

## 2024-09-18 DIAGNOSIS — F410 Panic disorder [episodic paroxysmal anxiety] without agoraphobia: Secondary | ICD-10-CM

## 2024-09-18 DIAGNOSIS — A599 Trichomoniasis, unspecified: Secondary | ICD-10-CM

## 2024-09-18 DIAGNOSIS — Z3A15 15 weeks gestation of pregnancy: Secondary | ICD-10-CM

## 2024-09-18 DIAGNOSIS — Z3482 Encounter for supervision of other normal pregnancy, second trimester: Secondary | ICD-10-CM

## 2024-09-18 NOTE — Progress Notes (Signed)
 Per client, picked up medicine for Trich 08/28/24 and completed 09/03/24. Reports last sex 05/2024 and partner (who lives in [place]) was treated. Counseled Rubella non-immune. Pink sticky noted to administer MMR x1 post-partum as 2 documented MMR vaccines in [facility].Call to [facility] this am to ascertain status of colpo referral from 08/27/24. Spoke with Charlene who states waiting to receive date of next colpo / LEEP clinic to schedule client. Per Allena, has spoken with Ms Boldin regarding this and client verified at [facility] this am. Desires AFP testing today. Burnadette Lowers, RN

## 2024-09-18 NOTE — Progress Notes (Signed)
 [facility] [address] [place] [address] Main phone: [phone]  Prenatal Visit  Subjective:  Shelley Arias is a 33 y.o. G2P1001 at [redacted]w[redacted]d being seen today for ongoing prenatal care.  She is currently monitored for the following issues for this low-risk pregnancy:   Patient Active Problem List   Diagnosis Date Noted   Low TSH level 09/18/2024   LGSIL on Pap smear of cervix 08/27/2024   Trichomoniasis 08/27/2024   Supervision of other normal pregnancy, antepartum 08/21/2024   Obesity in pregnancy Pregravid BMI=33 08/21/2024   Generalized anxiety disorder with panic attacks 05/25/2018   Major depressive disorder, recurrent episode, moderate (HCC) 05/25/2018   HPI Patient reports intermittent menstrual like cramps when active that resolve with rest, headaches from time to time that are relieved with tylenol , recent nose bleed and dizziness with activity. Pt Denies leaking of fluid/ROM or vaginal bleeding.  The following portions of the patient's history were reviewed and updated as appropriate: allergies, current medications, past family history, past medical history, past social history, past surgical history and problem list. Problem list updated.  Objective:   Vitals:   09/18/24 0908  BP: 94/61  Pulse: 80  Temp: 98.9 F (37.2 C)  Weight: 180 lb 6.4 oz (81.8 kg)    Fetal Status: Fetal Heart Rate (bpm): 150 Fundal Height:  (Fundus felt 2 finger breath below umbilicus) Movement:  (pt states she feels flutters)     General:  Alert, oriented and cooperative. Patient is in no acute distress.  Skin: Skin is warm and dry. No rash noted.   Cardiovascular: Normal heart rate noted  Respiratory: Normal respiratory effort, no problems with respiration noted  Abdomen: Soft, gravid, appropriate for gestational age.  Pain/Pressure: Absent     Pelvic: Cervical exam deferred        Extremities: Normal range  of motion.  Edema: None  Mental Status: Normal mood and affect. Normal behavior. Normal judgment and thought content.   Assessment and Plan:  Pregnancy: G2P1001 at [redacted]w[redacted]d   1. [redacted] weeks gestation of pregnancy (Primary) -RV in 4 weeks.  - AFP, Serum, Open Spina Bifida  2. Supervision of other normal pregnancy, antepartum -reports HA- relief with Tylenol  -occasional dizziness- encouraged to increase fluids, which will also help with cramping  -BP 94/6 WNL, denies current dizziness, well-appearing in clinic. -dating US  on 11/4, agrees with LMP  - US  OB Comp + 14 Wk; Future  3. Obesity in pregnancy Pregravid BMI=33 -TWG: 10 lb 6.4 oz (4.717 kg)  -Counseled on risk associated with excessive or inadequate gestational weight gain including: labor complications, large for gestation infants and low birth weight.  4. Generalized anxiety disorder with panic attacks -Doing well during visit, no s/sx of anxiety.   5. Low TSH level -TSH on 08/21/24 was 0.363, repeat TSH today  - TSH   Preterm labor symptoms and general obstetric precautions including but not limited to vaginal bleeding, contractions, leaking of fluid and fetal movement were reviewed in detail with the patient. Please refer to After Visit Summary for other counseling recommendations.  Return in about 4 weeks (around 10/16/2024) for routine prenatal care.  Future Appointments  Date Time Provider Department Center  10/23/2024  8:20 AM [facility] PROVIDER [facility] None    Kisean Rollo GORMAN Pouch, NP  Attestation of Supervision of Advanced Practitioner (CNM/PA/NP): Evaluation and management procedures were performed by the Advanced Practice Provider under my supervision and collaboration.  I have reviewed  the Advanced Practice Provider's note and chart, and I agree with the management and plan. I have also made any necessary editorial changes.   I was working along side this practitioner all day and all medical plans were discussed with  me.   Verneta Bers, [place]

## 2024-09-19 ENCOUNTER — Telehealth: Payer: Self-pay

## 2024-09-19 NOTE — Telephone Encounter (Signed)
 TC to patient to inform of 10/16/24 U/S at [facility] at 1:00 pm. Patient to arrive at 12:45 with full bladder. LM with number to call.Izetta Parish, RN

## 2024-09-24 LAB — AFP, SERUM, OPEN SPINA BIFIDA
AFP MoM: 1.17
AFP Value: 32.9 ng/mL
Gest. Age on Collection Date: 15.7 wk
Maternal Age At EDD: 32.5 a
OSBR Risk 1 IN: 7137
Test Results:: NEGATIVE
Weight: 180 [lb_av]

## 2024-09-24 LAB — TSH: TSH: 1.15 u[IU]/mL (ref 0.450–4.500)

## 2024-09-25 ENCOUNTER — Ambulatory Visit: Payer: Self-pay | Admitting: Family Medicine

## 2024-09-25 DIAGNOSIS — R7989 Other specified abnormal findings of blood chemistry: Secondary | ICD-10-CM

## 2024-09-25 DIAGNOSIS — Z3480 Encounter for supervision of other normal pregnancy, unspecified trimester: Secondary | ICD-10-CM

## 2024-09-25 NOTE — Progress Notes (Signed)
 Negative AFP. TSH normal. Chart updated. To be discussed at next prenatal visit.   Dorothyann Helling, MD 09/25/24  9:45 AM

## 2024-09-27 ENCOUNTER — Telehealth: Payer: Self-pay | Admitting: Family Medicine

## 2024-09-27 NOTE — Addendum Note (Signed)
 Addended by: Nikolas Casher on: 09/27/2024 01:49 PM   Modules accepted: Orders

## 2024-10-01 NOTE — Telephone Encounter (Signed)
 Returned patient phone call. Questions answered about type of U/S on 10/16/24. No further questions. BTHIELE RN

## 2024-10-03 NOTE — Telephone Encounter (Signed)
 Patient called and aware of 10/16/24 U/S at [facility] at 1:00 pm. WALLIS RN

## 2024-10-16 ENCOUNTER — Ambulatory Visit
Admission: RE | Admit: 2024-10-16 | Discharge: 2024-10-16 | Disposition: A | Discharge status: Home / Self Care | Source: Ambulatory Visit

## 2024-10-16 ENCOUNTER — Other Ambulatory Visit: Payer: Self-pay

## 2024-10-16 DIAGNOSIS — R7989 Other specified abnormal findings of blood chemistry: Secondary | ICD-10-CM | POA: Insufficient documentation

## 2024-10-16 DIAGNOSIS — Z3686 Encounter for antenatal screening for cervical length: Secondary | ICD-10-CM | POA: Insufficient documentation

## 2024-10-16 DIAGNOSIS — Z3480 Encounter for supervision of other normal pregnancy, unspecified trimester: Secondary | ICD-10-CM | POA: Diagnosis present

## 2024-10-22 ENCOUNTER — Telehealth: Payer: Self-pay

## 2024-10-22 NOTE — Telephone Encounter (Signed)
 Patient called and confirmed appointment for tomorrow morning. BTHIELE RN

## 2024-10-23 ENCOUNTER — Ambulatory Visit

## 2024-10-23 VITALS — BP 102/66 | HR 87 | Temp 97.9°F | Wt 185.2 lb

## 2024-10-23 DIAGNOSIS — Z3480 Encounter for supervision of other normal pregnancy, unspecified trimester: Secondary | ICD-10-CM

## 2024-10-23 DIAGNOSIS — O99212 Obesity complicating pregnancy, second trimester: Secondary | ICD-10-CM

## 2024-10-23 DIAGNOSIS — Z3A20 20 weeks gestation of pregnancy: Secondary | ICD-10-CM

## 2024-10-23 DIAGNOSIS — A599 Trichomoniasis, unspecified: Secondary | ICD-10-CM

## 2024-10-23 DIAGNOSIS — R87612 Low grade squamous intraepithelial lesion on cytologic smear of cervix (LGSIL): Secondary | ICD-10-CM

## 2024-10-23 DIAGNOSIS — F411 Generalized anxiety disorder: Secondary | ICD-10-CM

## 2024-10-23 DIAGNOSIS — F410 Panic disorder [episodic paroxysmal anxiety] without agoraphobia: Secondary | ICD-10-CM

## 2024-10-23 DIAGNOSIS — R7989 Other specified abnormal findings of blood chemistry: Secondary | ICD-10-CM

## 2024-10-23 DIAGNOSIS — Z3482 Encounter for supervision of other normal pregnancy, second trimester: Secondary | ICD-10-CM

## 2024-10-23 DIAGNOSIS — O99210 Obesity complicating pregnancy, unspecified trimester: Secondary | ICD-10-CM

## 2024-10-23 NOTE — Progress Notes (Signed)
 " [facility] [address], [address] [place] [address] Main phone: [phone]  Prenatal Visit  Subjective:  Shelley Arias is a 33 y.o. G2P1001 at [redacted]w[redacted]d being seen today for ongoing prenatal care.  She is currently monitored for the following issues for this low-risk pregnancy:   Patient Active Problem List   Diagnosis Date Noted   Low TSH level 09/18/2024   LGSIL on Pap smear of cervix 08/27/2024   Trichomoniasis 08/27/2024   Supervision of other normal pregnancy, antepartum 08/21/2024   Obesity in pregnancy Pregravid BMI=33 08/21/2024   Generalized anxiety disorder with panic attacks 05/25/2018   Major depressive disorder, recurrent episode, moderate (HCC) 05/25/2018   HPI Patient reports shortness of breath when she goes up and down the stairs and walking long distances and discomfort sleeping. She reports rest helps with episodes of shortness of breath..  Contractions: Irregular (BH contractions). Vag. Bleeding: None.  Movement: Present. Pt Denies leaking of fluid/ROM.   The following portions of the patient's history were reviewed and updated as appropriate: allergies, current medications, past family history, past medical history, past social history, past surgical history and problem list. Problem list updated.  Objective:   Vitals:   10/23/24 0839  BP: 102/66  Pulse: 87  Temp: 97.9 F (36.6 C)  Weight: 185 lb 3.2 oz (84 kg)   Total weight gain from pre-pregnancy weight: 15 lb 3.2 oz (6.895 kg)  Fetal Status: Fetal Heart Rate (bpm): 152 Fundal Height: 22 cm Movement: Present    Fundal height trends reviewed - appropriate for EGA  General:  Alert, oriented and cooperative. Patient is in no acute distress.  Skin: Skin is warm and dry. No rash noted.   Cardiovascular: Normal heart rate noted  Respiratory: Normal respiratory effort, no problems with respiration noted  Abdomen: Soft, gravid, appropriate  for gestational age.  Pain/Pressure: Absent     Pelvic: Cervical exam deferred        Extremities: Normal range of motion.  Edema: None  Mental Status: Normal mood and affect. Normal behavior. Normal judgment and thought content.   Assessment and Plan:  Pregnancy: G2P1001 at [redacted]w[redacted]d  1. [redacted] weeks gestation of pregnancy (Primary) -RV in 4 weeks.   2. Supervision of other normal pregnancy, antepartum -Discussed Negative AFP.  -Pt doing well so far in pregnancy. Discussed supportive pillows for sleep comfort measures. - Rec. Frequent breaks during long walking periods, and compression stockings for long periods of standing. Gave ED precautions for worsening dyspnea.   - Awaiting anatomy scan reading from 10/16/24, will notify pt of results accordingly.  -Aware of f/u colpo appt with [facility] on 10/30/24 -Continues to take PNV without concerns.    3. Obesity in pregnancy Pregravid BMI=33 TWG: 15 lb 3.2 oz (6.895 kg) Has gained 5  lbs in 5 weeks.   4. Generalized anxiety disorder with panic attacks -Irritated/ experiencing mood swings from time to time but then calms down.  -Alan was her therapist here at [facility], doesn't express need at this time and has adequate support system.   5. Low TSH level -TSH level WNL, no further action indicated at this time.    Preterm labor symptoms and general obstetric precautions including but not limited to vaginal bleeding, contractions, leaking of fluid and fetal movement were reviewed in detail with the patient. Please refer to After Visit Summary for other counseling recommendations.  No follow-ups on file.  Future Appointments  Date Time Provider Department  Center  10/30/2024  9:15 AM Starla Harland BROCKS, MD [facility] None    Hardin GORMAN Pouch, NP "

## 2024-10-23 NOTE — Progress Notes (Signed)
 Reminded of upcoming appointment 10/31/2023. Called [place] to request read of U/S.BTHIELE RN

## 2024-10-24 ENCOUNTER — Other Ambulatory Visit: Payer: Self-pay | Admitting: Family Medicine

## 2024-10-24 DIAGNOSIS — Z3480 Encounter for supervision of other normal pregnancy, unspecified trimester: Secondary | ICD-10-CM

## 2024-10-30 ENCOUNTER — Encounter: Payer: Self-pay | Admitting: Obstetrics & Gynecology

## 2024-10-30 ENCOUNTER — Ambulatory Visit: Admitting: Obstetrics & Gynecology

## 2024-10-30 VITALS — BP 115/77 | HR 98 | Ht 61.0 in | Wt 186.0 lb

## 2024-10-30 DIAGNOSIS — Z3A21 21 weeks gestation of pregnancy: Secondary | ICD-10-CM

## 2024-10-30 DIAGNOSIS — B977 Papillomavirus as the cause of diseases classified elsewhere: Secondary | ICD-10-CM | POA: Diagnosis not present

## 2024-10-30 DIAGNOSIS — O98512 Other viral diseases complicating pregnancy, second trimester: Secondary | ICD-10-CM

## 2024-10-30 NOTE — Progress Notes (Signed)
" ° ° °  GYNECOLOGY PROGRESS NOTE  Subjective:    Patient ID: Shelley Arias, female    DOB: 03-29-1991, 34 y.o.   MRN: [id number]  HPI  Patient is a 34 y.o. G2P1001 at 21.[redacted] weeks EGA referred here from the health dept for a colpo due to pap showing + HR HPV. She reports that this is her first abnormal pap smear. She reports good FM, denies bleeding, ROM, contractions. She gets her prenatal care at the health dept.  The following portions of the patient's history were reviewed and updated as appropriate: allergies, current medications, past family history, past medical history, past social history, past surgical history, and problem list.  Review of Systems Pertinent items are noted in HPI.   Objective:   Blood pressure 115/77, pulse 98, height 5' 1 (1.549 m), weight 186 lb (84.4 kg), last menstrual period 05/31/2024. Body mass index is 35.14 kg/m. Well nourished, well hydrated female, no apparent distress She is ambulating and conversing normally. Abd- benign, gravid FHR- 160 consent signed, time out done Speculum placed. Cervix prepped with acetic acid. Transformation zone seen in its entirety. Colpo adequate. Colposcopic findings normal.  She tolerated the procedure well.    Assessment:   1. High risk HPV infection   2. [redacted] weeks gestation of pregnancy      Plan:   1. High risk HPV infection (Primary) - rec pap smear next year  2. [redacted] weeks gestation of pregnancy - Routine care at the health dept   "

## 2024-11-02 ENCOUNTER — Telehealth: Payer: Self-pay

## 2024-11-02 NOTE — Telephone Encounter (Signed)
 Patient called to notify of U/S date and time. U/S scheduled for 02/03 @ 1 pm. Patient aware however states she needs an early a.m. appointment or late afternoon appointment ( around 4 pm). BTHIELE RN

## 2024-11-05 ENCOUNTER — Telehealth: Payer: Self-pay | Admitting: Family Medicine

## 2024-11-05 NOTE — Telephone Encounter (Signed)
 Returned patient phone call. Patient c/o numbness and swelling of both hands (left > right). States she cannot open her left hand and is painful. Admits to repetitive activity at work. Denies facial swelling, headaches or visual changes. Complains of ankle swelling when she stands up for long hours at work. Patient states she was considering going to Urgent Care. Schedule a OB Problem visit for tomorrow morning. Patient made aware of U/S at 0900 11/27/2024. BTHIELE RN

## 2024-11-06 ENCOUNTER — Ambulatory Visit

## 2024-11-06 ENCOUNTER — Encounter

## 2024-11-06 VITALS — BP 107/73 | HR 82 | Temp 98.6°F | Wt 186.8 lb

## 2024-11-06 DIAGNOSIS — Z3480 Encounter for supervision of other normal pregnancy, unspecified trimester: Secondary | ICD-10-CM

## 2024-11-06 DIAGNOSIS — O99210 Obesity complicating pregnancy, unspecified trimester: Secondary | ICD-10-CM

## 2024-11-06 DIAGNOSIS — O99352 Diseases of the nervous system complicating pregnancy, second trimester: Secondary | ICD-10-CM

## 2024-11-06 DIAGNOSIS — O99212 Obesity complicating pregnancy, second trimester: Secondary | ICD-10-CM

## 2024-11-06 DIAGNOSIS — Z3A22 22 weeks gestation of pregnancy: Secondary | ICD-10-CM

## 2024-11-06 DIAGNOSIS — G5600 Carpal tunnel syndrome, unspecified upper limb: Secondary | ICD-10-CM

## 2024-11-06 DIAGNOSIS — Z3482 Encounter for supervision of other normal pregnancy, second trimester: Secondary | ICD-10-CM

## 2024-11-06 NOTE — Progress Notes (Signed)
 " [facility] [address], [address] [place] [address] Main phone: [phone]  Prenatal Visit  Subjective:  Shelley Arias is a 34 y.o. G2P1001 at [redacted]w[redacted]d being seen today seen today for acute early prenatal care. Planning to receive prenatal care here so worked in for wrist pain and numbness.   Patient Active Problem List   Diagnosis Date Noted   Low TSH level 09/18/2024   LGSIL on Pap smear of cervix 08/27/2024   Trichomoniasis 08/27/2024   Supervision of other normal pregnancy, antepartum 08/21/2024   Obesity in pregnancy Pregravid BMI=33 08/21/2024   Generalized anxiety disorder with panic attacks 05/25/2018   Major depressive disorder, recurrent episode, moderate (HCC) 05/25/2018   HPI Patient reports Numbness in hands bilaterally for 3 weeks. On left side can't move thumb and has been experiencing sharp pain that started last week. 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  She has tried to take a tylenol  as of last night and she feels like that has helped.  She then woke up this morning and feels like has doesn't have full sensation in both hands. Prior to pregnancy reports that she has had isolated episodes of wrist pain  Contractions: Not present. Vag. Bleeding: None.  Movement: Present. Pt Denies leaking of fluid/ROM.   The following portions of the patient's history were reviewed and updated as appropriate: allergies, current medications, past family history, past medical history, past social history, past surgical history and problem list. Problem list updated.  Objective:   Vitals:   11/06/24 0806  BP: 107/73  Pulse: 82  Temp: 98.6 F (37 C)  Weight: 186 lb 12.8 oz (84.7 kg)    Fetal Status: Fetal Heart Rate (bpm): 150 Fundal Height: 23 cm Movement: Present      General:  Alert, oriented and cooperative. Patient is in no  acute distress.  Skin: Skin is warm and dry. No rash noted.   Cardiovascular: Normal heart rate noted  Respiratory: Normal respiratory effort, no problems with respiration noted  Abdomen: Soft, gravid, appropriate for gestational age.  Pain/Pressure: Absent     Pelvic: Cervical exam deferred        Extremities: Normal range of motion.  Edema: Trace (Right hand only)  Mental Status: Normal mood and affect. Normal behavior. Normal judgment and thought content.   Assessment and Plan:  Pregnancy: G2P1001 at [redacted]w[redacted]d  1. [redacted] weeks gestation of pregnancy (Primary) -RV in 4 weeks.  2. Supervision of other normal pregnancy, antepartum -Continues to take PNV without concerns.   -Fetal movement noted, fundal height appropriate for GA.   3. Obesity in pregnancy Pregravid BMI=33 -TWG: 16 lb 12.8 oz (7.62 kg) Has gained 1  lbs in 2 weeks. -Continues to take low-dose ASA.  4. Pregnancy related carpal tunnel syndrome in second trimester -Discussed Carpal Tunnel flare ups in pregnancy and common signs and symptoms. -Recommended continuing conservative measures such as day/nighttime wrist splinting, keeping hands in neutral positioning, avoiding activities that provoke onset of worsening symptoms such as movements that require prolonged wrist flexion. -Discussed/reviewed hand exercises to aid in reducing swelling and improving mobility and dexterity. -ED precautions given if pain/numbness worsens. -Work accommodations were discussed at this visit, pt to request form from job and f/u at RV to be completed.   Preterm labor symptoms and general obstetric precautions including but not limited to vaginal bleeding, contractions, leaking  of fluid and fetal movement were reviewed in detail with the patient. Please refer to After Visit Summary for other counseling recommendations.  Return in about 4 weeks (around 12/04/2024) for routine prenatal care.  Future Appointments  Date Time Provider Department Center   11/20/2024  9:00 AM [facility] PROVIDER [facility] None  11/27/2024  9:00 AM [facility]  4 [facility]    Suresh Audi GORMAN Pouch, NP "

## 2024-11-07 ENCOUNTER — Telehealth: Payer: Self-pay

## 2024-11-07 NOTE — Telephone Encounter (Signed)
"   Verified patient using 2 patient identifiers.  -Called patient to let her know her work accomodation paperwork is ready to be picked up. -Copy was made to be scanned into chart accordingly.  Hardin Pouch, [facility]  "

## 2024-11-09 NOTE — Progress Notes (Signed)
 Added to pap reminder list. Larraine JONELLE Novak, RN

## 2024-11-15 ENCOUNTER — Telehealth: Payer: Self-pay

## 2024-11-15 ENCOUNTER — Telehealth: Payer: Self-pay | Admitting: Family Medicine

## 2024-11-15 NOTE — Telephone Encounter (Signed)
 Called patient and utilized two verifiers to confirm patient identity.   F/u on worsening wrist pain. Pt expresses wrist pain is getting worse.  She reports it always gets worse when she is at work as she works with her hands a lot. She reports wrist bone feels like it has popped out a little  to the point where she had to hold it for a little bit, and that helped give her some relief. Feels as if the braces not working and I where them every day 6-8 hours a day  She endorses a continuous sharp pain on left wrist. Has been taking tylenol  for the pain at night that  helps ease the pain.   Discussed sending patient to orthopedic specialist to further evaluate and manage worsening wrist pain, pt accepts.  Referral formed filled out to be faxed to [facility] in [place].    Hardin Pouch, [facility]

## 2024-11-16 ENCOUNTER — Ambulatory Visit

## 2024-11-16 VITALS — BP 106/70 | HR 90 | Ht 61.5 in | Wt 190.2 lb

## 2024-11-16 DIAGNOSIS — M654 Radial styloid tenosynovitis [de Quervain]: Secondary | ICD-10-CM | POA: Diagnosis not present

## 2024-11-16 DIAGNOSIS — G5603 Carpal tunnel syndrome, bilateral upper limbs: Secondary | ICD-10-CM

## 2024-11-16 NOTE — Progress Notes (Signed)
 "  Office Visit Note   Patient: Shelley Arias           Date of Birth: Aug 03, 1991           MRN: [id number] Visit Date: 11/16/2024              Requested by: Department, [facility] [address] [place],  [place] [address] PCP: Department, [facility]   Assessment & Plan: Visit Diagnoses:  1. Bilateral carpal tunnel syndrome   2. De Quervain's tenosynovitis, left     Plan: Discussed with patient at length. Recommend rest and ice of both wrists, tylenol  for pain. Given thumb spica brace for left wrist and cockup wrist brace for right wrist to wear at night. Will follow up prn  Orders:  No orders of the defined types were placed in this encounter.    Subjective: Chief Complaint: Bilateral wrist pain, left thumb pain  HPI Patient is a 34 y.o. year old female who presents with wrist pain involving the bilateral wrist. Onset of the symptoms was several months ago. Inciting event: none known. Current symptoms include numbness and tingling of thumb, index, and middle finger of both hands. Patient has had no prior wrist problems. Treatment to date: none. Also complains of thumb pain in the left hand that extends into wrist at base of thumb. Pain is present when she moves the thumb in certain ways. No other complaints  Objective: Vital Signs: BP 106/70   Pulse 90   Ht 5' 1.5 (1.562 m)   Wt 86.3 kg   LMP 05/31/2024 (Exact Date)   BMI 35.36 kg/m   Physical Exam Gen: Alert, No Acute Distress Right wrist: Skin intact, no erythema or induration noted. positive Phalen, positive Tinel. NVID Left wrist: Skin intact, no erythema or induration noted. positive Phalen, positive Tinel. + finklestein, TTP over radial styloid. NVID  Imaging: Imaging deferred today due to current pregnancy   PMFS History: Patient Active Problem List   Diagnosis Date Noted   Low TSH level 09/18/2024   LGSIL on Pap smear of cervix 08/27/2024   Trichomoniasis  08/27/2024   Supervision of other normal pregnancy, antepartum 08/21/2024   Obesity in pregnancy Pregravid BMI=33 08/21/2024   Generalized anxiety disorder with panic attacks 05/25/2018   Major depressive disorder, recurrent episode, moderate (HCC) 05/25/2018   Past Medical History:  Diagnosis Date   [redacted] weeks gestation of pregnancy 08/09/2024   Anemia    Anxiety    Depression    Overweight 09/06/2014   Patient denies medical problems    Viral illness 08/09/2024    Family History  Problem Relation Age of Onset   Congestive Heart Failure Mother    Hyperlipidemia Father    Diabetes Maternal Grandfather    Heart disease Maternal Grandfather    Hypertension Maternal Grandfather    Diabetes Paternal Grandmother    Heart disease Paternal Grandmother    Hypertension Paternal Grandmother    Diabetes Paternal Grandfather    Heart disease Paternal Grandfather    Hypertension Paternal Grandfather     Past Surgical History:  Procedure Laterality Date   CESAREAN SECTION  06/15/2013   Social History   Occupational History   Not on file  Tobacco Use   Smoking status: Former    Types: Cigarettes    Passive exposure: Never   Smokeless tobacco: Never   Tobacco comments:    Stopped cigarettes 07/06/2024. Stopped vaping 2024  Vaping Use  Vaping status: Former   Substances: Nicotine, Flavoring  Substance and Sexual Activity   Alcohol use: Not Currently    Comment: last use 06/21/24   Drug use: Never   Sexual activity: Not Currently    Birth control/protection: None   Current Outpatient Medications  Medication Instructions   acetaminophen  (TYLENOL ) 1,000 mg, Every 6 hours PRN   benzonatate  (TESSALON ) 200 mg, Oral, Every 8 hours   cetirizine  (ZYRTEC  ALLERGY) 10 mg, Oral, Daily   fluticasone  (FLONASE ) 50 MCG/ACT nasal spray 1 spray, Each Nare, Daily   ipratropium (ATROVENT ) 0.06 % nasal spray 2 sprays, Each Nare, 4 times daily   Prenatal Vit-Fe Fumarate-FA (MULTIVITAMIN-PRENATAL)  27-0.8 MG TABS tablet 1 tablet, Daily   promethazine -dextromethorphan (PROMETHAZINE -DM) 6.25-15 MG/5ML syrup 5 mLs, Oral, 4 times daily PRN   Allergies as of 11/16/2024 - Review Complete 11/06/2024  Allergen Reaction Noted   Penicillins Nausea And Vomiting and Swelling 08/01/2015   "

## 2024-11-20 ENCOUNTER — Ambulatory Visit

## 2024-11-26 ENCOUNTER — Ambulatory Visit

## 2024-11-27 ENCOUNTER — Ambulatory Visit

## 2024-11-27 ENCOUNTER — Telehealth: Payer: Self-pay

## 2024-11-27 NOTE — Telephone Encounter (Signed)
 Rescheduled patient appointment. BTHIELE RN

## 2024-12-11 ENCOUNTER — Ambulatory Visit
